# Patient Record
Sex: Male | Born: 1945 | Marital: Single | Smoking: Former smoker
Health system: Southern US, Community
[De-identification: ages and names within clinical notes are randomized; demographics above are authoritative.]

## PROBLEM LIST (undated history)

## (undated) DIAGNOSIS — I10 Essential (primary) hypertension: Secondary | ICD-10-CM

## (undated) DIAGNOSIS — E119 Type 2 diabetes mellitus without complications: Secondary | ICD-10-CM

## (undated) DIAGNOSIS — F329 Major depressive disorder, single episode, unspecified: Secondary | ICD-10-CM

## (undated) DIAGNOSIS — J189 Pneumonia, unspecified organism: Secondary | ICD-10-CM

## (undated) DIAGNOSIS — F32A Depression, unspecified: Secondary | ICD-10-CM

---

## 2016-06-07 ENCOUNTER — Other Ambulatory Visit (HOSPITAL_COMMUNITY): Payer: Self-pay | Admitting: Pulmonary Disease

## 2016-06-07 DIAGNOSIS — C799 Secondary malignant neoplasm of unspecified site: Secondary | ICD-10-CM

## 2016-06-10 ENCOUNTER — Other Ambulatory Visit (HOSPITAL_COMMUNITY): Payer: Self-pay

## 2016-06-10 ENCOUNTER — Ambulatory Visit (HOSPITAL_COMMUNITY): Payer: Self-pay

## 2016-06-14 ENCOUNTER — Inpatient Hospital Stay (HOSPITAL_COMMUNITY): Payer: Medicare Other

## 2016-06-14 ENCOUNTER — Inpatient Hospital Stay (HOSPITAL_COMMUNITY)
Admission: AD | Admit: 2016-06-14 | Discharge: 2016-06-21 | DRG: 208 | Disposition: A | Discharge status: Other Acute Inpatient Hospital | Payer: Medicare Other | Source: Other Acute Inpatient Hospital | Attending: Internal Medicine | Admitting: Internal Medicine

## 2016-06-14 DIAGNOSIS — K219 Gastro-esophageal reflux disease without esophagitis: Secondary | ICD-10-CM | POA: Diagnosis present

## 2016-06-14 DIAGNOSIS — D72829 Elevated white blood cell count, unspecified: Secondary | ICD-10-CM

## 2016-06-14 DIAGNOSIS — J181 Lobar pneumonia, unspecified organism: Secondary | ICD-10-CM | POA: Diagnosis not present

## 2016-06-14 DIAGNOSIS — I480 Paroxysmal atrial fibrillation: Secondary | ICD-10-CM | POA: Diagnosis present

## 2016-06-14 DIAGNOSIS — R4182 Altered mental status, unspecified: Secondary | ICD-10-CM

## 2016-06-14 DIAGNOSIS — R0602 Shortness of breath: Secondary | ICD-10-CM | POA: Diagnosis present

## 2016-06-14 DIAGNOSIS — J9811 Atelectasis: Secondary | ICD-10-CM | POA: Diagnosis present

## 2016-06-14 DIAGNOSIS — J189 Pneumonia, unspecified organism: Secondary | ICD-10-CM | POA: Diagnosis not present

## 2016-06-14 DIAGNOSIS — J90 Pleural effusion, not elsewhere classified: Secondary | ICD-10-CM | POA: Diagnosis present

## 2016-06-14 DIAGNOSIS — E43 Unspecified severe protein-calorie malnutrition: Secondary | ICD-10-CM | POA: Diagnosis present

## 2016-06-14 DIAGNOSIS — F329 Major depressive disorder, single episode, unspecified: Secondary | ICD-10-CM | POA: Diagnosis present

## 2016-06-14 DIAGNOSIS — Z9911 Dependence on respirator [ventilator] status: Secondary | ICD-10-CM | POA: Diagnosis not present

## 2016-06-14 DIAGNOSIS — E669 Obesity, unspecified: Secondary | ICD-10-CM | POA: Diagnosis present

## 2016-06-14 DIAGNOSIS — Z930 Tracheostomy status: Secondary | ICD-10-CM

## 2016-06-14 DIAGNOSIS — I132 Hypertensive heart and chronic kidney disease with heart failure and with stage 5 chronic kidney disease, or end stage renal disease: Secondary | ICD-10-CM | POA: Diagnosis present

## 2016-06-14 DIAGNOSIS — N186 End stage renal disease: Secondary | ICD-10-CM | POA: Diagnosis present

## 2016-06-14 DIAGNOSIS — Z87891 Personal history of nicotine dependence: Secondary | ICD-10-CM

## 2016-06-14 DIAGNOSIS — J9622 Acute and chronic respiratory failure with hypercapnia: Secondary | ICD-10-CM | POA: Diagnosis present

## 2016-06-14 DIAGNOSIS — Z789 Other specified health status: Secondary | ICD-10-CM

## 2016-06-14 DIAGNOSIS — Z430 Encounter for attention to tracheostomy: Secondary | ICD-10-CM

## 2016-06-14 DIAGNOSIS — E872 Acidosis: Secondary | ICD-10-CM | POA: Diagnosis present

## 2016-06-14 DIAGNOSIS — L89150 Pressure ulcer of sacral region, unstageable: Secondary | ICD-10-CM | POA: Diagnosis present

## 2016-06-14 DIAGNOSIS — I509 Heart failure, unspecified: Secondary | ICD-10-CM | POA: Diagnosis present

## 2016-06-14 DIAGNOSIS — G9340 Encephalopathy, unspecified: Secondary | ICD-10-CM | POA: Diagnosis present

## 2016-06-14 DIAGNOSIS — Z6823 Body mass index (BMI) 23.0-23.9, adult: Secondary | ICD-10-CM

## 2016-06-14 DIAGNOSIS — J440 Chronic obstructive pulmonary disease with acute lower respiratory infection: Secondary | ICD-10-CM | POA: Diagnosis present

## 2016-06-14 DIAGNOSIS — J9690 Respiratory failure, unspecified, unspecified whether with hypoxia or hypercapnia: Secondary | ICD-10-CM | POA: Diagnosis not present

## 2016-06-14 DIAGNOSIS — E870 Hyperosmolality and hypernatremia: Secondary | ICD-10-CM | POA: Diagnosis not present

## 2016-06-14 DIAGNOSIS — E875 Hyperkalemia: Secondary | ICD-10-CM | POA: Diagnosis not present

## 2016-06-14 DIAGNOSIS — R310 Gross hematuria: Secondary | ICD-10-CM | POA: Diagnosis not present

## 2016-06-14 DIAGNOSIS — E1122 Type 2 diabetes mellitus with diabetic chronic kidney disease: Secondary | ICD-10-CM | POA: Diagnosis present

## 2016-06-14 DIAGNOSIS — J151 Pneumonia due to Pseudomonas: Secondary | ICD-10-CM | POA: Diagnosis present

## 2016-06-14 DIAGNOSIS — E785 Hyperlipidemia, unspecified: Secondary | ICD-10-CM | POA: Diagnosis present

## 2016-06-14 DIAGNOSIS — N319 Neuromuscular dysfunction of bladder, unspecified: Secondary | ICD-10-CM | POA: Diagnosis present

## 2016-06-14 DIAGNOSIS — N170 Acute kidney failure with tubular necrosis: Secondary | ICD-10-CM | POA: Diagnosis present

## 2016-06-14 DIAGNOSIS — L8990 Pressure ulcer of unspecified site, unspecified stage: Secondary | ICD-10-CM | POA: Insufficient documentation

## 2016-06-14 DIAGNOSIS — D62 Acute posthemorrhagic anemia: Secondary | ICD-10-CM | POA: Diagnosis not present

## 2016-06-14 DIAGNOSIS — J9620 Acute and chronic respiratory failure, unspecified whether with hypoxia or hypercapnia: Secondary | ICD-10-CM | POA: Diagnosis not present

## 2016-06-14 DIAGNOSIS — I5032 Chronic diastolic (congestive) heart failure: Secondary | ICD-10-CM | POA: Diagnosis present

## 2016-06-14 DIAGNOSIS — E871 Hypo-osmolality and hyponatremia: Secondary | ICD-10-CM | POA: Diagnosis present

## 2016-06-14 DIAGNOSIS — J9621 Acute and chronic respiratory failure with hypoxia: Secondary | ICD-10-CM | POA: Diagnosis present

## 2016-06-14 DIAGNOSIS — J441 Chronic obstructive pulmonary disease with (acute) exacerbation: Secondary | ICD-10-CM | POA: Diagnosis present

## 2016-06-14 DIAGNOSIS — E869 Volume depletion, unspecified: Secondary | ICD-10-CM | POA: Diagnosis present

## 2016-06-14 DIAGNOSIS — T829XXA Unspecified complication of cardiac and vascular prosthetic device, implant and graft, initial encounter: Secondary | ICD-10-CM

## 2016-06-14 DIAGNOSIS — Z1624 Resistance to multiple antibiotics: Secondary | ICD-10-CM | POA: Diagnosis present

## 2016-06-14 HISTORY — DX: Major depressive disorder, single episode, unspecified: F32.9

## 2016-06-14 HISTORY — DX: Pneumonia, unspecified organism: J18.9

## 2016-06-14 HISTORY — DX: Essential (primary) hypertension: I10

## 2016-06-14 HISTORY — DX: Type 2 diabetes mellitus without complications: E11.9

## 2016-06-14 HISTORY — DX: Depression, unspecified: F32.A

## 2016-06-14 LAB — CBC WITH DIFFERENTIAL/PLATELET
BASOS PCT: 1 %
Basophils Absolute: 0.2 10*3/uL — ABNORMAL HIGH (ref 0.0–0.1)
EOS PCT: 0 %
Eosinophils Absolute: 0 10*3/uL (ref 0.0–0.7)
HEMATOCRIT: 21.4 % — AB (ref 39.0–52.0)
HEMOGLOBIN: 7.3 g/dL — AB (ref 13.0–17.0)
LYMPHS ABS: 1.2 10*3/uL (ref 0.7–4.0)
Lymphocytes Relative: 5 %
MCH: 31.5 pg (ref 26.0–34.0)
MCHC: 34.1 g/dL (ref 30.0–36.0)
MCV: 92.2 fL (ref 78.0–100.0)
Monocytes Absolute: 0.7 10*3/uL (ref 0.1–1.0)
Monocytes Relative: 3 %
Neutro Abs: 22.7 10*3/uL — ABNORMAL HIGH (ref 1.7–7.7)
Neutrophils Relative %: 91 %
Platelets: 274 10*3/uL (ref 150–400)
RBC: 2.32 MIL/uL — AB (ref 4.22–5.81)
RDW: 17.1 % — ABNORMAL HIGH (ref 11.5–15.5)
WBC Morphology: INCREASED
WBC: 24.8 10*3/uL — AB (ref 4.0–10.5)

## 2016-06-14 LAB — COMPREHENSIVE METABOLIC PANEL
ALBUMIN: 1.5 g/dL — AB (ref 3.5–5.0)
ALK PHOS: 302 U/L — AB (ref 38–126)
ALT: 88 U/L — AB (ref 17–63)
ANION GAP: 14 (ref 5–15)
AST: 101 U/L — AB (ref 15–41)
BILIRUBIN TOTAL: 0.9 mg/dL (ref 0.3–1.2)
BUN: 160 mg/dL — AB (ref 6–20)
CALCIUM: 7.4 mg/dL — AB (ref 8.9–10.3)
CO2: 25 mmol/L (ref 22–32)
Chloride: 94 mmol/L — ABNORMAL LOW (ref 101–111)
Creatinine, Ser: 2.48 mg/dL — ABNORMAL HIGH (ref 0.61–1.24)
GFR calc Af Amer: 29 mL/min — ABNORMAL LOW (ref >60)
GFR calc non Af Amer: 25 mL/min — ABNORMAL LOW (ref >60)
GLUCOSE: 229 mg/dL — AB (ref 65–99)
Potassium: 4.4 mmol/L (ref 3.5–5.1)
SODIUM: 133 mmol/L — AB (ref 135–145)
TOTAL PROTEIN: 6.2 g/dL — AB (ref 6.5–8.1)

## 2016-06-14 LAB — PROTIME-INR
INR: 1.22
PROTHROMBIN TIME: 15.5 s — AB (ref 11.4–15.2)

## 2016-06-14 LAB — DIGOXIN LEVEL: Digoxin Level: 0.6 ng/mL — ABNORMAL LOW (ref 0.8–2.0)

## 2016-06-14 LAB — POCT I-STAT 3, ART BLOOD GAS (G3+)
Acid-Base Excess: 2 mmol/L (ref 0.0–2.0)
BICARBONATE: 26.3 mmol/L (ref 20.0–28.0)
O2 Saturation: 95 %
PO2 ART: 74 mmHg — AB (ref 83.0–108.0)
Patient temperature: 98.6
TCO2: 28 mmol/L (ref 0–100)
pCO2 arterial: 40.2 mmHg (ref 32.0–48.0)
pH, Arterial: 7.424 (ref 7.350–7.450)

## 2016-06-14 LAB — GLUCOSE, CAPILLARY: Glucose-Capillary: 201 mg/dL — ABNORMAL HIGH (ref 65–99)

## 2016-06-14 LAB — URINALYSIS, ROUTINE W REFLEX MICROSCOPIC
BILIRUBIN URINE: NEGATIVE
Glucose, UA: NEGATIVE mg/dL
Ketones, ur: NEGATIVE mg/dL
NITRITE: NEGATIVE
PROTEIN: 100 mg/dL — AB
Specific Gravity, Urine: 1.011 (ref 1.005–1.030)
Squamous Epithelial / LPF: NONE SEEN
pH: 8 (ref 5.0–8.0)

## 2016-06-14 LAB — PHOSPHORUS: Phosphorus: 5.6 mg/dL — ABNORMAL HIGH (ref 2.5–4.6)

## 2016-06-14 LAB — LACTIC ACID, PLASMA: Lactic Acid, Venous: 0.8 mmol/L (ref 0.5–1.9)

## 2016-06-14 LAB — PROCALCITONIN: Procalcitonin: 1.59 ng/mL

## 2016-06-14 LAB — MAGNESIUM: Magnesium: 2 mg/dL (ref 1.7–2.4)

## 2016-06-14 MED ORDER — PRISMASOL BGK 4/2.5 32-4-2.5 MEQ/L IV SOLN
INTRAVENOUS | Status: DC
  Administered 2016-06-14 – 2016-06-15 (×2): via INTRAVENOUS_CENTRAL
  Filled 2016-06-14 (×2): qty 5000

## 2016-06-14 MED ORDER — SODIUM CHLORIDE 0.9 % IV SOLN
250.0000 mL | INTRAVENOUS | Status: DC | PRN
  Administered 2016-06-15: 250 mL via INTRAVENOUS

## 2016-06-14 MED ORDER — PRISMASOL BGK 4/2.5 32-4-2.5 MEQ/L IV SOLN
INTRAVENOUS | Status: DC
  Administered 2016-06-14 – 2016-06-15 (×2): via INTRAVENOUS_CENTRAL
  Filled 2016-06-14 (×3): qty 5000

## 2016-06-14 MED ORDER — SODIUM CHLORIDE 0.9 % FOR CRRT
INTRAVENOUS_CENTRAL | Status: DC | PRN
  Filled 2016-06-14: qty 1000

## 2016-06-14 MED ORDER — HEPARIN SODIUM (PORCINE) 1000 UNIT/ML DIALYSIS
1000.0000 [IU] | INTRAMUSCULAR | Status: DC | PRN
Start: 2016-06-14 — End: 2016-06-16
  Administered 2016-06-16: 5000 [IU] via INTRAVENOUS_CENTRAL
  Filled 2016-06-14: qty 6
  Filled 2016-06-14: qty 5

## 2016-06-14 MED ORDER — HEPARIN SODIUM (PORCINE) 5000 UNIT/ML IJ SOLN
5000.0000 [IU] | Freq: Three times a day (TID) | INTRAMUSCULAR | Status: DC
  Administered 2016-06-14: 5000 [IU] via SUBCUTANEOUS
  Filled 2016-06-14: qty 1

## 2016-06-14 MED ORDER — CHLORHEXIDINE GLUCONATE 0.12% ORAL RINSE (MEDLINE KIT)
15.0000 mL | Freq: Two times a day (BID) | OROMUCOSAL | Status: DC
  Administered 2016-06-15 – 2016-06-21 (×14): 15 mL via OROMUCOSAL

## 2016-06-14 MED ORDER — LEVOFLOXACIN IN D5W 250 MG/50ML IV SOLN
250.0000 mg | INTRAVENOUS | Status: DC
  Administered 2016-06-15: 250 mg via INTRAVENOUS
  Filled 2016-06-14: qty 50

## 2016-06-14 MED ORDER — SODIUM CHLORIDE 0.9 % IV SOLN
1250.0000 mg | Freq: Once | INTRAVENOUS | Status: AC
  Administered 2016-06-14: 1250 mg via INTRAVENOUS
  Filled 2016-06-14: qty 1250

## 2016-06-14 MED ORDER — SODIUM CHLORIDE 0.9% FLUSH: 3.0000 mL | INTRAVENOUS | Status: DC | PRN

## 2016-06-14 MED ORDER — SODIUM CHLORIDE 0.9% FLUSH
3.0000 mL | Freq: Two times a day (BID) | INTRAVENOUS | Status: DC
  Administered 2016-06-14: 3 mL via INTRAVENOUS

## 2016-06-14 MED ORDER — MIDAZOLAM HCL 2 MG/2ML IJ SOLN
INTRAMUSCULAR | Status: AC
  Filled 2016-06-14: qty 2

## 2016-06-14 MED ORDER — ORAL CARE MOUTH RINSE
15.0000 mL | Freq: Four times a day (QID) | OROMUCOSAL | Status: DC
  Administered 2016-06-15 – 2016-06-21 (×26): 15 mL via OROMUCOSAL

## 2016-06-14 MED ORDER — PANTOPRAZOLE SODIUM 40 MG IV SOLR
40.0000 mg | Freq: Every day | INTRAVENOUS | Status: DC
  Administered 2016-06-14 – 2016-06-20 (×7): 40 mg via INTRAVENOUS
  Filled 2016-06-14 (×7): qty 40

## 2016-06-14 MED ORDER — IPRATROPIUM-ALBUTEROL 0.5-2.5 (3) MG/3ML IN SOLN
3.0000 mL | Freq: Four times a day (QID) | RESPIRATORY_TRACT | Status: DC
  Administered 2016-06-14 – 2016-06-19 (×21): 3 mL via RESPIRATORY_TRACT
  Filled 2016-06-14 (×22): qty 3

## 2016-06-14 MED ORDER — LEVOFLOXACIN IN D5W 500 MG/100ML IV SOLN
500.0000 mg | Freq: Once | INTRAVENOUS | Status: AC
  Administered 2016-06-14: 500 mg via INTRAVENOUS
  Filled 2016-06-14: qty 100

## 2016-06-14 MED ORDER — VANCOMYCIN HCL IN DEXTROSE 750-5 MG/150ML-% IV SOLN
750.0000 mg | INTRAVENOUS | Status: DC
  Administered 2016-06-14 – 2016-06-15 (×2): 750 mg via INTRAVENOUS
  Filled 2016-06-14 (×2): qty 150

## 2016-06-14 MED ORDER — BUDESONIDE 0.5 MG/2ML IN SUSP
0.5000 mg | Freq: Two times a day (BID) | RESPIRATORY_TRACT | Status: DC
  Administered 2016-06-14 – 2016-06-21 (×14): 0.5 mg via RESPIRATORY_TRACT
  Filled 2016-06-14 (×14): qty 2

## 2016-06-14 MED ORDER — INSULIN ASPART 100 UNIT/ML ~~LOC~~ SOLN
2.0000 [IU] | SUBCUTANEOUS | Status: DC
  Administered 2016-06-14 – 2016-06-15 (×3): 6 [IU] via SUBCUTANEOUS

## 2016-06-14 MED ORDER — PRISMASOL BGK 4/2.5 32-4-2.5 MEQ/L IV SOLN
INTRAVENOUS | Status: DC
  Administered 2016-06-14 – 2016-06-16 (×13): via INTRAVENOUS_CENTRAL
  Filled 2016-06-14 (×19): qty 5000

## 2016-06-14 MED ORDER — MIDAZOLAM HCL 2 MG/2ML IJ SOLN: 2.0000 mg | Freq: Once | INTRAMUSCULAR | Status: DC

## 2016-06-14 MED ORDER — SODIUM CHLORIDE 0.9 % IV SOLN
250.0000 mL | INTRAVENOUS | Status: DC | PRN
  Administered 2016-06-14: 250 mL via INTRAVENOUS

## 2016-06-14 MED ORDER — DEXTROSE 5 % IV SOLN
2.0000 g | Freq: Two times a day (BID) | INTRAVENOUS | Status: DC
  Administered 2016-06-14 – 2016-06-15 (×3): 2 g via INTRAVENOUS
  Filled 2016-06-14 (×4): qty 2

## 2016-06-14 NOTE — H&P (Signed)
PULMONARY / CRITICAL CARE MEDICINE   Name: Bryan Frank MRN: [id number] DOB: 27-Mar-1945    ADMISSION DATE:  06/14/2016 CONSULTATION DATE:  6/5  REFERRING MD:  [facility]  CHIEF COMPLAINT:  Renal failure  HISTORY OF PRESENT ILLNESS:   71 year old male with PMH as below, which is significant for DM2, HLD, GERD, HTN, and tobacco abuse. It appears as though he was admitted to [facility] [place] [facility] 04/2016 for severe COPD exacerbation and dense pneumonia. Also concern for UTI and treated with broad spectrum antibiotics. He improved and was transferred to [facility] in [place] 4/19. At that time he was on supplemental O2 during the day and BiPAP at night. On 4/21 his condition worsened requiring intubation. Likely due to severe COPD and possible PNA. He was unable to wean and had tracheostomy placed 4/27. He was found to have bilateral pleural effusions and is s/p pleurex catheter and thoracentesis. Cytology so far negative. He also required HD for worsening renal failure. This continued to worsen. Course also complicated by copious respiratory secretions. 6/5 he was transferred to [facility] at family request for further care and continued hemodialysis.   PAST MEDICAL HISTORY :  He  has no past medical history on file.  PAST SURGICAL HISTORY: He  has no past surgical history on file.  Allergies not on file  No current facility-administered medications on file prior to encounter.    No current outpatient prescriptions on file prior to encounter.    FAMILY HISTORY:  His has no family status information on file.    SOCIAL HISTORY: He    REVIEW OF SYSTEMS:   unable  SUBJECTIVE:  unable  VITAL SIGNS: BP 132/71   Pulse (!) 102   Resp (!) 23   Wt 80.2 kg (176 lb 12.9 oz)   SpO2 100%   HEMODYNAMICS:    VENTILATOR SETTINGS: Vent Mode: PRVC FiO2 (%):  [40 %] 40 % Set Rate:  [16 bmp] 16 bmp Vt Set:  [500 mL] 500 mL PEEP:  [5 cmH20] 5 cmH20  INTAKE / OUTPUT: No  intake/output data recorded.  PHYSICAL EXAMINATION: General:  Elderly male in NAD on vent Neuro:  unresponsive HEENT:  [place]/AT, PERRL, NO JVD. Trach in place Cardiovascular:  RRR, no MRG Lungs:  Minimal wheeze, diminished bases Abdomen:  Soft, non-distended. Erythema to PEG site Musculoskeletal:  No acute deformity Skin:  Large sacral decub to bone.   LABS:  BMET No results for input(s): NA, K, CL, CO2, BUN, CREATININE, GLUCOSE in the last 168 hours.  Electrolytes No results for input(s): CALCIUM, MG, PHOS in the last 168 hours.  CBC No results for input(s): WBC, HGB, HCT, PLT in the last 168 hours.  Coag's No results for input(s): APTT, INR in the last 168 hours.  Sepsis Markers No results for input(s): LATICACIDVEN, PROCALCITON, O2SATVEN in the last 168 hours.  ABG No results for input(s): PHART, PCO2ART, PO2ART in the last 168 hours.  Liver Enzymes No results for input(s): AST, ALT, ALKPHOS, BILITOT, ALBUMIN in the last 168 hours.  Cardiac Enzymes No results for input(s): TROPONINI, PROBNP in the last 168 hours.  Glucose No results for input(s): GLUCAP in the last 168 hours.  Imaging No results found.   STUDIES:  Ct head 6/5 >>>  CULTURES: Tracheal asp 6/3 > pseudomonas, corynebacterium, klebsiella.   ANTIBIOTICS: Cefepime 5/29 >6/5 Ceftaz 6/5 > Levaquin 6/5 > Vancomycin 6/5 >  SIGNIFICANT EVENTS: [facility] to [facility]19 [facility]  LINES/TUBES: Trace 4/27 > PEG 4/27 > Tunneled RIJ HD cath 6/5 >  DISCUSSION:   ASSESSMENT / PLAN:  PULMONARY A: Acute on chronic hypoxemic and hypercarbic respiratory failure. Severe COPD with +/- exacerbation Pulmonary edema  HCAP - cultures pseudomonas and corynebacterium (no sens done) Bilateral pleural effusions  P:   Full vent support Weaning ability unclear ABG CXR VAP bundle Scheduled bronchodilators, nebulized steroids.  CARDIOVASCULAR A:  CHF unspecified  P:  Telemetry monitoring in  ICU setting Monitor hemodynamics Continue digoxin, isosorbide dinitrate, asa,    RENAL A:   AKI on CKD stage IV Neurogenic bladder Hyponatremia  P:   Nephrology following Planning CRRT Follow chemistries  GASTROINTESTINAL A:   GERD  P:   Protonix Continue TF  HEMATOLOGIC A:   ? Malignant lesion of sphenoid skull  P:  Follow CBC, Coags CT head pending  INFECTIOUS A:   HCAP - cultures pseudomonas, klebsiella, and corynebacterium (no sens done). Has been worsening despite treatment with cefepime Likely UTI Sacral decub PEG site erythema  P:   Ceftaz, Levaquin, vanco Pan cultured PCT UA pending Wound care consult  ENDOCRINE A:   DM  P:   SSI  NEUROLOGIC A:  Acute encephalopathy, etiology unclear  P:   RASS goal: 0 CT head pending   FAMILY  - Updates: No family present  - Inter-disciplinary family meet or Palliative Care meeting due by:  6/12   Georgann Housekeeper, AGACNP-BC [place] [facility]/Critical Care Pager [phone] or [phone]  06/14/2016 8:15 PM     ATTENDING NOTE / ATTESTATION NOTE :   I have discussed the case with the resident/APP  Georgann Housekeeper NP  I agree with the resident/APP's  history, physical examination, assessment, and plans.    I have edited the above note and modified it according to our agreed history, physical examination, assessment and plan.   Briefly, 71 year old male with PMH as below, which is significant for DM2, HLD, GERD, HTN, and tobacco abuse. No information available in EPIC. Chart review was done by the team.  It appears as though he was admitted to [place], [place] hospital 04/2016 for severe COPD exacerbation and dense pneumonia. Also concern for UTI and treated with broad spectrum antibiotics. He improved and was transferred to [facility] in [place] 4/19. At that time he was on supplemental O2 during the day and BiPAP at night. On 4/21 his condition worsened requiring intubation. Likely  due to severe COPD and possible PNA. He was unable to wean and had tracheostomy placed 4/27. He was found to have bilateral pleural effusions and is s/p pleurex catheter and thoracentesis. Cytology so far negative. He also required HD for worsening renal failure. This continued to worsen. Course also complicated by copious respiratory secretions. 6/5 he was transferred to [facility] at family request for further care and continued hemodialysis. Renal service was following pt at [place]. He had his HD catheter placed today.  Respiratory secretions with PSA and Corynebacterium.   Vitals:  Vitals:   06/14/16 1904 06/14/16 1935 06/14/16 2103  BP: 132/71    Pulse: (!) 102    Resp: (!) 23    Temp:  98.6 F (37 C)   TempSrc:  Oral   SpO2: 100%  100%  Weight: 80.2 kg (176 lb 12.9 oz)      Constitutional/General: well-nourished, well-developed, intubated, not in any distress, comfortable  There is no height or weight on file to calculate BMI. Wt Readings from Last 3 Encounters:  06/14/16 80.2 kg (176 lb 12.9 oz)    HEENT: PERLA, anicteric sclerae. (-) Oral thrush. Trache in place with thick nasty looking secretions per trache.   Neck: No masses. Midline trachea. No JVD, (-) LAD. (-) bruits appreciated.  Respiratory/Chest: Grossly normal chest. (-) deformity. (-) Accessory muscle use.  Symmetric expansion. Diminished BS on both lower lung zones. (-) wheezing, crackles, (+) rhonchi at bases.  (-) egophony  Cardiovascular: Regular rate and  rhythm, heart sounds normal, no murmur or gallops,  Trace peripheral edema  Gastrointestinal:  Normal bowel sounds. Soft, non-tender. No hepatosplenomegaly.  (-) masses.   Musculoskeletal:  Unable to assess.  Extremities: Grossly normal. (-) clubbing, cyanosis.  (-) edema  Skin: (-) rash,lesions seen.   Neurological/Psychiatric : . Pupils were sluggish. Corneals were reactive. No facial symmetry. Does not follow commands. May be some grimace on  deep sternal rub. No purposeful movement. Supple neck. (-) lateralizing signs    CBC Recent Labs     06/14/16  1956  WBC  24.8*  HGB  7.3*  HCT  21.4*  PLT  274    Coag's Recent Labs     06/14/16  1956  INR  1.22    BMET Recent Labs     06/14/16  1956  NA  133*  K  4.4  CL  94*  CO2  25  BUN  160*  CREATININE  2.48*  GLUCOSE  229*    Electrolytes Recent Labs     06/14/16  1956  CALCIUM  7.4*  MG  2.0  PHOS  5.6*    Sepsis Markers No results for input(s): PROCALCITON, O2SATVEN in the last 72 hours.  Invalid input(s): LACTICACIDVEN  ABG Recent Labs     06/14/16  2006  PHART  7.424  PCO2ART  40.2  PO2ART  74.0*    Liver Enzymes Recent Labs     06/14/16  1956  AST  101*  ALT  88*  ALKPHOS  302*  BILITOT  0.9  ALBUMIN  1.5*    Cardiac Enzymes No results for input(s): TROPONINI, PROBNP in the last 72 hours.  Glucose Recent Labs     06/14/16  1931  GLUCAP  201*    Imaging Ct Head Wo Contrast  Result Date: 06/14/2016 CLINICAL DATA:  Altered mental status EXAM: CT HEAD WITHOUT CONTRAST TECHNIQUE: Contiguous axial images were obtained from the base of the skull through the vertex without intravenous contrast. COMPARISON:  None. FINDINGS: Brain: No mass lesion, intraparenchymal hemorrhage or extra-axial collection. No evidence of acute cortical infarct. There is periventricular hypoattenuation compatible with chronic microvascular disease. There is diffuse atrophy without lobar predominance. Vascular: Atherosclerotic calcification of the vertebral and internal carotid arteries at the skull base. Skull: Normal visualized skull base, calvarium and extracranial soft tissues. Sinuses/Orbits: No sinus fluid levels or advanced mucosal thickening. No mastoid effusion. Normal orbits. IMPRESSION: Chronic microvascular ischemia without acute intracranial abnormality. Electronically Signed   By: Ulyses Jarred M.D.   On: 06/14/2016 21:20   Dg Chest Port 1  View  Result Date: 06/14/2016 CLINICAL DATA:  Pneumonia. EXAM: PORTABLE CHEST 1 VIEW COMPARISON:  None. FINDINGS: Patient is rotated to the left. Tracheostomy tube appears in adequate position. There is a right IJ central venous catheter with tip just below the cavoatrial junction. Lungs are adequately inflated with opacification over the left base which may be due to effusion and atelectasis although cannot exclude infection. Mild cardiomegaly. Calcified plaque is present over the aortic arch.  There is minimal degenerate change of the spine. IMPRESSION: Left base opacification which may be due to atelectasis and effusion versus infection. Follow-up PA and lateral chest x-ray would be helpful for further evaluation. Cardiomegaly. Aortic atherosclerosis. Tubes and lines as described. Electronically Signed   By: Marin Olp M.D.   On: 06/14/2016 20:08    Assessment/Plan : Encephalopathy, multifactorial. Likely related to medicines/sedatives in a background of chronic kidney disease. Could be related to sepsis. Could be related to acute on chronic kidney disease. - Cranial CT scan was unremarkable. If he does not wake up in the morning, he will most likely need a brain MRI +/- EEG +/- LP.   - Avoid sedatives. We want to let patient wake up and see how he does neurologically. - Panculture. Continue broad-spectrum antibiotics pending culture. - Renal service has already seen the patient. They're familiar with the patient. They have ordered CRRT.   Concern for sepsis likely related to tracheitis vs VAP. Patient with history of pseudomonas aeruginosa and Corynebacterium. Urine also looks cloudy.  - Panculture. Continue ceftazidime and levofloxacin and vancomycin. De-escalate once with cultures. - check PCT. - May need a pan scan of the chest and abdomen if he's not significantly improved in the morning. Chest x-ray however is only remarkable for a possible left lower lobe atelectasis vs infiltrate.     Acute hypoxemic respiratory failure secondary to unable to protect airway +/- Tracheitis vs VAP.S/P tracheostomy - Weaning once he is awake.Mental status is barrier.  - Panculture. Cont antibiotics. - Pulmicort neb BID + Duoneb QID - check echo ( R/O CHF/CAD)   AKI/CKD - renal service following - plan for CRRT   Severe  Malnutrition. - Start tube  Feeds.   DVT prophylaxis : heparin SQ PPI for SUP   I spent  30  minutes of Critical Care time with this patient today. This is my time spent independent of the APP or resident.   Family :   No family at bedside.    Monica Becton, MD 06/14/2016, 9:44 PM [facility] Pager [phone] After 3 pm or if no answer, call [phone]

## 2016-06-14 NOTE — Progress Notes (Signed)
Pharmacy Antibiotic Note  Bryan Frank is a 71 y.o. male transferred to [facility] on 06/14/2016 due to acute renal failure.  Pharmacy has been consulted for vancomycin, fortaz, and levaquin dosing.  71 year old male with PMH as below, which is significant for DM2, HLD, GERD, HTN, and tobacco abuse.  Patient transferred from [facility] this evening. He was being treated with cefepime for his HCAP. Will transition to new broad antibiotics. Patient also starting CRRT for his acute renal failure so will need to adjust dosing. See section below and paper chart for organisms and sensitivities from [facility].  Plan: Vancomycin 1250mg  x 1 then 750mg  IV every 24 hours hours.  Goal trough 15-20 mcg/mL.  Ceftazidime 2g q12 hours Levaquin 500mg  x1 tonight then 250mg  q24 hours  Weight: 176 lb 12.9 oz (80.2 kg)  Temp (24hrs), Avg:98.6 F (37 C), Min:98.6 F (37 C), Max:98.6 F (37 C)   Recent Labs Lab 06/14/16 1956  WBC 24.8*  CREATININE 2.48*  LATICACIDVEN 0.8    CrCl cannot be calculated (Unknown ideal weight.).    No Known Allergies  Antimicrobials this admission: Vanc 6/5 >> Fortaz 6/5>> Levaquin 6/5>>  Microbiology results: 5/31 Respiratory cx: carbapenemase resistant pseudomonas - I to fortaz, S to amikacin Corynebacterium: no sensitivities Kleb peumo: Pan sensitive with I sensitivities to ampicillin  Thank you for allowing pharmacy to be a part of this patient's care.  Erin Hearing PharmD., BCPS Clinical Pharmacist Pager [phone] 06/14/2016 9:06 PM

## 2016-06-14 NOTE — Progress Notes (Signed)
[facility] Progress Note Patient Name: Bryan Frank DOB: 12-08-1945 MRN: [id number]   Date of Service  06/14/2016  HPI/Events of Note  Hematuria - s/p foley change. Last Hgb = 7.3, INR = 1.22 and Platelets = 274K.  eICU Interventions  Will order: 1. H/H Q 6 hours. 2. PTT now.  3. D/C Heparin [place].     Intervention Category Major Interventions: Other:  Lysle Dingwall 06/14/2016, 10:57 PM

## 2016-06-14 NOTE — Consult Note (Signed)
Referring Provider: No ref. provider found Primary Care Physician:  No primary care provider on file. Primary Nephrologist:  none   Reason for Consultation:   Azotemia acute non oliguric renal failure and hyperkalemia  HPI:  [facility] well known to me. Patient debilitated transferred from [place] and has been hospitalized in [facility] with a prolonged ventilator wean. He was initially stable and had developed acute renal failure requiring intermittent HD. He had a tracheostomy and was weaned to a T bar. Over the course of the last 2 weeks he had recovered renal function and was clinically looking better however over the weekend he became more confused and was found to have a climbing wbc with worsening respiratory status. He was place back on the ventilator. Although non oliguric, his BUN has been disproportionately elevated. It was considered that sepsis from worsening pneumonia was playing a considerable role. He has a pleural effusion drained to help improve his lung mechanics last week and cytology was pending. He was placed on cefipime. He has become increasingly hyperkalemic  No past medical history on file.  No past surgical history on file.  Prior to Admission medications   Not on File    No current facility-administered medications for this encounter.     Allergies as of 06/14/2016  . (Not on File)    No family history on file.  Social History   Social History  . Marital status: N/A    Spouse name: N/A  . Number of children: N/A  . Years of education: N/A   Occupational History  . Not on file.   Social History Main Topics  . Smoking status: Not on file  . Smokeless tobacco: Not on file  . Alcohol use Not on file  . Drug use: Unknown  . Sexual activity: Not on file   Other Topics Concern  . Not on file   Social History Narrative  . No narrative on file    Review of Systems:  Unable to provide   Physical Exam: Vital signs in  last 24 hours: Temp:  [98.6 F (37 C)] 98.6 F (37 C) (06/05 1935) Pulse Rate:  [102] 102 (06/05 1904) Resp:  [23] 23 (06/05 1904) BP: (132)/(71) 132/71 (06/05 1904) SpO2:  [100 %] 100 % (06/05 1904) FiO2 (%):  [40 %] 40 % (06/05 1904) Weight:  [176 lb 12.9 oz (80.2 kg)] 176 lb 12.9 oz (80.2 kg) (06/05 1904)   General:   Ill appearing confused Head:  Normocephalic and atraumatic. Eyes:  Sclera clear, no icterus.   Conjunctiva pink. Ears:  Normal auditory acuity. Nose:  No deformity, discharge,  or lesions. Mouth:  No deformity or lesions, dentition normal. Neck:  Supple; no masses or thyromegaly. JVP not elevated Lungs:   Trach and ventilator  Permcath Heart:  Regular rate with systolic faint murmur  Abdomen:  Soft, nontender  Hypoactive    Msk:  Symmetrical without gross deformities. Normal posture. Pulses:  No carotid, renal, femoral bruits. DP and PT symmetrical and equal Extremities:  Without clubbing or edema. Neurologic:   Confused and disorientated  Skin:  Sacral wound stage 4  Cervical Nodes:  No significant cervical adenopathy.    Intake/Output from previous day: No intake/output data recorded. Intake/Output this shift: No intake/output data recorded.  Lab Results: No results for input(s): WBC, HGB, HCT, PLT in the last 72 hours. BMET No results for input(s): NA, K, CL, CO2, GLUCOSE, BUN, CREATININE, CALCIUM, PHOS in the last 72  hours.  Invalid input(s): MAG LFT No results for input(s): PROT, ALBUMIN, AST, ALT, ALKPHOS, BILITOT, BILIDIR, IBILI in the last 72 hours. PT/INR No results for input(s): LABPROT, INR in the last 72 hours. Hepatitis Panel No results for input(s): HEPBSAG, HCVAB, HEPAIGM, HEPBIGM in the last 72 hours.  Studies/Results: No results found.  Assessment/Plan:  Acute Renal insufficiency in setting of worsening sepsis . Will proceed with CRRT    Azotemia - catabolic, septic and dry with increased protein Tube feeds. Not sure that this is  going to improve without dialysis and certainly expectations of the family are to proceed with dialysis despite very poor condition.  Hyperkalemia  Should resolve with dialysis  Volume appears fairly controlled mild volume depletion  Metabolic acidosis should improve with dialysis  Sepsis  Antibiotics and cultures recommended  Wound care  Feeding TF   LOS: 0 Sanaai Doane W @TODAY @7 :54 PM

## 2016-06-15 ENCOUNTER — Inpatient Hospital Stay (HOSPITAL_COMMUNITY): Payer: Medicare Other

## 2016-06-15 ENCOUNTER — Encounter (HOSPITAL_COMMUNITY): Payer: Self-pay | Admitting: *Deleted

## 2016-06-15 DIAGNOSIS — J9621 Acute and chronic respiratory failure with hypoxia: Principal | ICD-10-CM

## 2016-06-15 DIAGNOSIS — J9622 Acute and chronic respiratory failure with hypercapnia: Secondary | ICD-10-CM

## 2016-06-15 LAB — CBC
HEMATOCRIT: 23.6 % — AB (ref 39.0–52.0)
HEMOGLOBIN: 7.5 g/dL — AB (ref 13.0–17.0)
MCH: 29.1 pg (ref 26.0–34.0)
MCHC: 31.8 g/dL (ref 30.0–36.0)
MCV: 91.5 fL (ref 78.0–100.0)
Platelets: 275 10*3/uL (ref 150–400)
RBC: 2.58 MIL/uL — AB (ref 4.22–5.81)
RDW: 17.2 % — ABNORMAL HIGH (ref 11.5–15.5)
WBC: 26.7 10*3/uL — AB (ref 4.0–10.5)

## 2016-06-15 LAB — GLUCOSE, CAPILLARY
GLUCOSE-CAPILLARY: 104 mg/dL — AB (ref 65–99)
GLUCOSE-CAPILLARY: 135 mg/dL — AB (ref 65–99)
GLUCOSE-CAPILLARY: 175 mg/dL — AB (ref 65–99)
GLUCOSE-CAPILLARY: 249 mg/dL — AB (ref 65–99)
Glucose-Capillary: 212 mg/dL — ABNORMAL HIGH (ref 65–99)
Glucose-Capillary: 112 mg/dL — ABNORMAL HIGH (ref 65–99)
Glucose-Capillary: 138 mg/dL — ABNORMAL HIGH (ref 65–99)

## 2016-06-15 LAB — BLOOD CULTURE ID PANEL (REFLEXED)
Acinetobacter baumannii: NOT DETECTED
CANDIDA PARAPSILOSIS: NOT DETECTED
Candida albicans: NOT DETECTED
Candida glabrata: NOT DETECTED
Candida krusei: NOT DETECTED
Candida tropicalis: NOT DETECTED
ENTEROCOCCUS SPECIES: NOT DETECTED
Enterobacter cloacae complex: NOT DETECTED
Enterobacteriaceae species: NOT DETECTED
Escherichia coli: NOT DETECTED
HAEMOPHILUS INFLUENZAE: NOT DETECTED
KLEBSIELLA OXYTOCA: NOT DETECTED
Klebsiella pneumoniae: NOT DETECTED
LISTERIA MONOCYTOGENES: NOT DETECTED
METHICILLIN RESISTANCE: DETECTED — AB
Neisseria meningitidis: NOT DETECTED
Proteus species: NOT DETECTED
Pseudomonas aeruginosa: NOT DETECTED
SERRATIA MARCESCENS: NOT DETECTED
STAPHYLOCOCCUS AUREUS BCID: NOT DETECTED
STREPTOCOCCUS PNEUMONIAE: NOT DETECTED
Staphylococcus species: DETECTED — AB
Streptococcus agalactiae: NOT DETECTED
Streptococcus pyogenes: NOT DETECTED
Streptococcus species: NOT DETECTED

## 2016-06-15 LAB — BASIC METABOLIC PANEL
ANION GAP: 12 (ref 5–15)
BUN: 123 mg/dL — ABNORMAL HIGH (ref 6–20)
CALCIUM: 7.6 mg/dL — AB (ref 8.9–10.3)
CHLORIDE: 95 mmol/L — AB (ref 101–111)
CO2: 26 mmol/L (ref 22–32)
Creatinine, Ser: 1.93 mg/dL — ABNORMAL HIGH (ref 0.61–1.24)
GFR calc Af Amer: 39 mL/min — ABNORMAL LOW (ref >60)
GFR calc non Af Amer: 34 mL/min — ABNORMAL LOW (ref >60)
GLUCOSE: 247 mg/dL — AB (ref 65–99)
POTASSIUM: 4 mmol/L (ref 3.5–5.1)
Sodium: 133 mmol/L — ABNORMAL LOW (ref 135–145)

## 2016-06-15 LAB — ABO/RH: ABO/RH(D): O POS

## 2016-06-15 LAB — RENAL FUNCTION PANEL
ALBUMIN: 1.6 g/dL — AB (ref 3.5–5.0)
ANION GAP: 10 (ref 5–15)
BUN: 53 mg/dL — AB (ref 6–20)
CO2: 26 mmol/L (ref 22–32)
Calcium: 7.6 mg/dL — ABNORMAL LOW (ref 8.9–10.3)
Chloride: 101 mmol/L (ref 101–111)
Creatinine, Ser: 0.93 mg/dL (ref 0.61–1.24)
GFR calc Af Amer: >60 mL/min (ref >60)
GFR calc non Af Amer: >60 mL/min (ref >60)
GLUCOSE: 119 mg/dL — AB (ref 65–99)
PHOSPHORUS: 2.4 mg/dL — AB (ref 2.5–4.6)
POTASSIUM: 3.3 mmol/L — AB (ref 3.5–5.1)
SODIUM: 137 mmol/L (ref 135–145)

## 2016-06-15 LAB — BLOOD GAS, ARTERIAL
Acid-Base Excess: 3 mmol/L — ABNORMAL HIGH (ref 0.0–2.0)
BICARBONATE: 27.5 mmol/L (ref 20.0–28.0)
DRAWN BY: 441371
FIO2: 35
O2 Content: 10 L/min
O2 Saturation: 95 %
PCO2 ART: 41.7 mmHg (ref 32.0–48.0)
PH ART: 7.425 (ref 7.350–7.450)
PO2 ART: 66.6 mmHg — AB (ref 83.0–108.0)
Patient temperature: 95.4

## 2016-06-15 LAB — ECHOCARDIOGRAM COMPLETE
HEIGHTINCHES: 69 in
Weight: 2804.25 oz

## 2016-06-15 LAB — ALBUMIN: Albumin: 1.5 g/dL — ABNORMAL LOW (ref 3.5–5.0)

## 2016-06-15 LAB — MAGNESIUM: Magnesium: 2 mg/dL (ref 1.7–2.4)

## 2016-06-15 LAB — HEMOGLOBIN AND HEMATOCRIT, BLOOD
HCT: 18.5 % — ABNORMAL LOW (ref 39.0–52.0)
Hemoglobin: 6.4 g/dL — CL (ref 13.0–17.0)

## 2016-06-15 LAB — PHOSPHORUS: PHOSPHORUS: 4.7 mg/dL — AB (ref 2.5–4.6)

## 2016-06-15 LAB — PREPARE RBC (CROSSMATCH)

## 2016-06-15 LAB — PROCALCITONIN: PROCALCITONIN: 0.93 ng/mL

## 2016-06-15 LAB — MRSA PCR SCREENING: MRSA BY PCR: NEGATIVE

## 2016-06-15 LAB — APTT: APTT: 39 s — AB (ref 24–36)

## 2016-06-15 MED ORDER — INSULIN ASPART 100 UNIT/ML ~~LOC~~ SOLN
0.0000 [IU] | SUBCUTANEOUS | Status: DC
  Administered 2016-06-15: 2 [IU] via SUBCUTANEOUS
  Administered 2016-06-15: 3 [IU] via SUBCUTANEOUS
  Administered 2016-06-15: 2 [IU] via SUBCUTANEOUS
  Administered 2016-06-16: 5 [IU] via SUBCUTANEOUS
  Administered 2016-06-16 (×2): 3 [IU] via SUBCUTANEOUS
  Administered 2016-06-16 – 2016-06-18 (×10): 5 [IU] via SUBCUTANEOUS
  Administered 2016-06-18: 3 [IU] via SUBCUTANEOUS
  Administered 2016-06-18: 5 [IU] via SUBCUTANEOUS
  Administered 2016-06-18 – 2016-06-19 (×4): 3 [IU] via SUBCUTANEOUS
  Administered 2016-06-19 (×2): 5 [IU] via SUBCUTANEOUS
  Administered 2016-06-19: 3 [IU] via SUBCUTANEOUS
  Administered 2016-06-20 (×2): 5 [IU] via SUBCUTANEOUS
  Administered 2016-06-20 (×2): 3 [IU] via SUBCUTANEOUS
  Administered 2016-06-20: 2 [IU] via SUBCUTANEOUS
  Administered 2016-06-21: 3 [IU] via SUBCUTANEOUS
  Administered 2016-06-21: 5 [IU] via SUBCUTANEOUS
  Administered 2016-06-21 (×3): 3 [IU] via SUBCUTANEOUS

## 2016-06-15 MED ORDER — CHLORHEXIDINE GLUCONATE CLOTH 2 % EX PADS
6.0000 | MEDICATED_PAD | Freq: Every day | CUTANEOUS | Status: DC
  Administered 2016-06-15 – 2016-06-21 (×6): 6 via TOPICAL

## 2016-06-15 MED ORDER — SODIUM CHLORIDE 0.9% FLUSH
10.0000 mL | Freq: Two times a day (BID) | INTRAVENOUS | Status: DC
Start: 2016-06-15 — End: 2016-06-16

## 2016-06-15 MED ORDER — COLLAGENASE 250 UNIT/GM EX OINT
TOPICAL_OINTMENT | Freq: Every day | CUTANEOUS | Status: DC
  Administered 2016-06-15: 10:00:00 via TOPICAL
  Administered 2016-06-16 – 2016-06-17 (×2): 1 via TOPICAL
  Administered 2016-06-19 – 2016-06-21 (×3): via TOPICAL
  Filled 2016-06-15 (×2): qty 30

## 2016-06-15 MED ORDER — DARBEPOETIN ALFA 200 MCG/0.4ML IJ SOSY
200.0000 ug | PREFILLED_SYRINGE | INTRAMUSCULAR | Status: DC
  Administered 2016-06-15: 200 ug via SUBCUTANEOUS
  Filled 2016-06-15: qty 0.4

## 2016-06-15 MED ORDER — PRO-STAT SUGAR FREE PO LIQD
30.0000 mL | Freq: Every day | ORAL | Status: DC
  Administered 2016-06-15 – 2016-06-18 (×12): 30 mL
  Filled 2016-06-15 (×14): qty 30

## 2016-06-15 MED ORDER — SODIUM CHLORIDE 0.9 % IV SOLN
Freq: Once | INTRAVENOUS | Status: AC
  Administered 2016-06-15: 21:00:00 via INTRAVENOUS

## 2016-06-15 MED ORDER — VITAL AF 1.2 CAL PO LIQD
1000.0000 mL | ORAL | Status: DC
  Administered 2016-06-15 – 2016-06-19 (×6): 1000 mL

## 2016-06-15 MED ORDER — SODIUM CHLORIDE 0.9% FLUSH: 10.0000 mL | INTRAVENOUS | Status: DC | PRN

## 2016-06-15 NOTE — Progress Notes (Signed)
This note also relates to the following rows which could not be included: SpO2 - Cannot attach notes to unvalidated device data  Pt keeps popping off vent.  RT placed pt on SBT 5/5.  Pt tolerated well.  RT placed pt on ATC 10L, 35%.  Pt tolerating it well.

## 2016-06-15 NOTE — Consult Note (Signed)
[place] Nurse wound consult note Reason for Consult: Consult requested for trach, PEG and sacrum Wound type: Trach site with generalized redness, no open wound.  Suture to left trach.  No topical care is needed for trach site except standard split thickness gauze, changed PRN to absorb drainage. Please remove suture as soon as possible to avoid creating a wound. PEG tube with generalized erythremia surrounding the insertion site, no open wounds.  Barrier cream to protect skin and repel moisture. There is a medical device which has sutures next to the PEG site. Sacrum with previous deep tissue injury located in a deep crevasse,with dark purple skin to edges; beginning to evolve into unstageable wound in the center with slough.  3X2X.2cm, small amt tan drainage. Lower sacrum/Upper buttocks area with patchy areas of deep tissue injuries; affected area 4X4cm, dark purple, no open wounds or drainage. Pressure Injury POA: Yes Dressing procedure/placement/frequency: Pt is on a low airloss bed to reduce pressure.  Santyl for enzymatic debridement of nonviable tissue to sacrum.  Foam dressing to protect from further injury.  No family members at the bedside to discuss plan of care and pt is on the vent. Please re-consult if further assistance is needed.  Thank-you,  Julien Girt MSN, [place], [place], [place], [place]

## 2016-06-15 NOTE — Progress Notes (Signed)
PULMONARY / CRITICAL CARE MEDICINE   Name: Bryan Frank MRN: [id number] DOB: 1945/02/20    ADMISSION DATE:  06/14/2016 CONSULTATION DATE:  6/5  REFERRING MD:  [facility]  CHIEF COMPLAINT:  Renal failure  HISTORY OF PRESENT ILLNESS:   71 year old male with PMH as below, which is significant for DM2, HLD, GERD, HTN, and tobacco abuse. It appears as though he was admitted to [place], [place] hospital 04/2016 for severe COPD exacerbation and dense pneumonia. Also concern for UTI and treated with broad spectrum antibiotics. He improved and was transferred to [facility] in [place] 4/19. At that time he was on supplemental O2 during the day and BiPAP at night. On 4/21 his condition worsened requiring intubation. Likely due to severe COPD and possible PNA. He was unable to wean and had tracheostomy placed 4/27. He was found to have bilateral pleural effusions and is s/p pleurex catheter and thoracentesis. Cytology so far negative. He also required HD for worsening renal failure. This continued to worsen. Course also complicated by copious respiratory secretions. 6/5 he was transferred to [facility] at family request for further care and continued hemodialysis.   SUBJECTIVE / interval events:  Patient has been experiencing hematuria since Foley catheter change 6/5  VITAL SIGNS: BP (!) 137/54   Pulse 94   Temp (!) 96 F (35.6 C) (Oral) Comment: warmer applied through CRRT  Resp 17   Ht 5\' 9"  (1.753 m)   Wt 79.5 kg (175 lb 4.3 oz)   SpO2 100%   BMI 25.88 kg/m   HEMODYNAMICS:    VENTILATOR SETTINGS: Vent Mode: PRVC FiO2 (%):  [35 %-40 %] 35 % Set Rate:  [14 bmp-16 bmp] 14 bmp Vt Set:  [500 mL] 500 mL PEEP:  [5 cmH20] 5 cmH20 Plateau Pressure:  [15 cmH20] 15 cmH20  INTAKE / OUTPUT: I/O last 3 completed shifts: In: 681 [I.V.:130; IV Piggyback:551] Out: 3979 [Urine:3215; Other:764]  PHYSICAL EXAMINATION: General:  Elderly man, ill-appearing, no distress, currently on trach  collar Neuro:  Opened his eyes to voice, tracks, followed some simple commands, nodded to questions HEENT:  No oral lesions, pupils equal and reactive, trach in place with one suture present Cardiovascular:  Irregular, no murmur Lungs:  Distant, no wheezing, somewhat coarse Abdomen: Soft, nondistended, some erythema around the PEG site, positive bowel sounds Musculoskeletal:  No deformities Skin: Several decubitus ulcer with some surrounding purple necrotic skin  LABS:  BMET  Recent Labs Lab 06/14/16 1956 06/15/16 0208  NA 133* 133*  K 4.4 4.0  CL 94* 95*  CO2 25 26  BUN 160* 123*  CREATININE 2.48* 1.93*  GLUCOSE 229* 247*    Electrolytes  Recent Labs Lab 06/14/16 1956 06/15/16 0208  CALCIUM 7.4* 7.6*  MG 2.0 2.0  PHOS 5.6* 4.7*    CBC  Recent Labs Lab 06/14/16 1956 06/15/16 0208  WBC 24.8* 26.7*  HGB 7.3* 7.5*  HCT 21.4* 23.6*  PLT 274 275    Coag's  Recent Labs Lab 06/14/16 1956 06/15/16 0208  APTT  --  39*  INR 1.22  --     Sepsis Markers  Recent Labs Lab 06/14/16 1956 06/14/16 2150 06/15/16 0208  LATICACIDVEN 0.8  --   --   PROCALCITON  --  1.59 0.93    ABG  Recent Labs Lab 06/14/16 2006  PHART 7.424  PCO2ART 40.2  PO2ART 74.0*    Liver Enzymes  Recent Labs Lab 06/14/16 1956 06/15/16 0208  AST 101*  --  ALT 88*  --   ALKPHOS 302*  --   BILITOT 0.9  --   ALBUMIN 1.5* 1.5*    Cardiac Enzymes No results for input(s): TROPONINI, PROBNP in the last 168 hours.  Glucose  Recent Labs Lab 06/14/16 1931 06/15/16 0043 06/15/16 0354 06/15/16 0734  GLUCAP 201* 249* 212* 135*    Imaging Ct Head Wo Contrast  Result Date: 06/14/2016 CLINICAL DATA:  Altered mental status EXAM: CT HEAD WITHOUT CONTRAST TECHNIQUE: Contiguous axial images were obtained from the base of the skull through the vertex without intravenous contrast. COMPARISON:  None. FINDINGS: Brain: No mass lesion, intraparenchymal hemorrhage or extra-axial  collection. No evidence of acute cortical infarct. There is periventricular hypoattenuation compatible with chronic microvascular disease. There is diffuse atrophy without lobar predominance. Vascular: Atherosclerotic calcification of the vertebral and internal carotid arteries at the skull base. Skull: Normal visualized skull base, calvarium and extracranial soft tissues. Sinuses/Orbits: No sinus fluid levels or advanced mucosal thickening. No mastoid effusion. Normal orbits. IMPRESSION: Chronic microvascular ischemia without acute intracranial abnormality. Electronically Signed   By: Ulyses Jarred M.D.   On: 06/14/2016 21:20   Dg Chest Port 1 View  Result Date: 06/14/2016 CLINICAL DATA:  Pneumonia. EXAM: PORTABLE CHEST 1 VIEW COMPARISON:  None. FINDINGS: Patient is rotated to the left. Tracheostomy tube appears in adequate position. There is a right IJ central venous catheter with tip just below the cavoatrial junction. Lungs are adequately inflated with opacification over the left base which may be due to effusion and atelectasis although cannot exclude infection. Mild cardiomegaly. Calcified plaque is present over the aortic arch. There is minimal degenerate change of the spine. IMPRESSION: Left base opacification which may be due to atelectasis and effusion versus infection. Follow-up PA and lateral chest x-ray would be helpful for further evaluation. Cardiomegaly. Aortic atherosclerosis. Tubes and lines as described. Electronically Signed   By: Marin Olp M.D.   On: 06/14/2016 20:08     STUDIES:  Ct head 6/5 >>> chronic microvascular ischemic changes without acute finding  CULTURES: resp cx 5/31 >> Klebsiella (pansensitive), pseudomonas (pan resistant, intermediate to ceftazidime, sensitive to aminoglycoside); Corynebacterium  Respiratory aspirate 6/3>> Klebsiella (pansensitive), pseudomonas (pan resistant, intermediate to ceftazidime, sensitive to aminoglycoside); Corynebacterium  Tracheal  aspirate 6/5 >> rare GNR, rare GPC, >>  Blood culture 6/5 >>  Urine 6/5 >>   ANTIBIOTICS: Cefepime 5/29 >6/5 Ceftaz 6/5 > Levaquin 6/5 > Vancomycin 6/5 >  SIGNIFICANT EVENTS: Admit to [facility] 4/19 [facility] to [facility]  LINES/TUBES: Trach 4/27 > PEG 4/27 > Tunneled RIJ HD cath 6/5 >  DISCUSSION:   ASSESSMENT / PLAN:  PULMONARY A: Acute on chronic hypoxemic and hypercarbic respiratory failure. Severe COPD with +/- exacerbation Pulmonary edema  HCAP - cultures pseudomonas and corynebacterium (no sens done) Bilateral pleural effusions  P:   Ventilator dependent since 4/21. Continue PRVC 8cc/kg and push for weaning if able to tolerate > PSV and ATC Follow chest x-ray VAP prevention order set Scheduled DuoNeb, scheduled Pulmicort nebs   CARDIOVASCULAR A:  CHF, unspecified Hyperlipidemia Atrial fibrillation with controlled rate (? Chronic)  P:  Echocardiogram ordered, pending Continue telemetry, hemodynamic monitoring Digoxin currently on hold Catapres, isosorbide on hold Hold aspirin and systemic anticoagulation while he's having hematuria   RENAL A:   Recurrent (required HD 4/21 - 06/01/16 at [facility]) acute renal failure, serologies negative for glomerulonephritis, likely ATN Neurogenic bladder Hyponatremia  P:   Appreciate nephrology assistance Continue CVVHD per their recommendations Hopefully he will  achieve some renal recovery, currently nonoliguric Follow BMP, urine output  GASTROINTESTINAL A:   GERD  P:   Protonix as ordered Tube feeding Per nutrition recommendations   HEMATOLOGIC A:   ? Malignant lesion of sphenoid skull  P:  Head CT reassuring, no evidence of lesion on the skull base, calvarium Follow CBC, coags intermittently  GENITOURINARY:  A:  Hematuria, suspect traumatic after Foley change  P: Maintaining urine output currently. May need to consider three-way catheter for bladder irrigation or urology  consultation.  INFECTIOUS A:   HCAP - cultures pseudomonas, klebsiella, and corynebacterium (no sens done). Has been worsening despite treatment with cefepime Likely UTI Sacral decub PEG site erythema   P:   Broad-spectrum antibiotics initiated: Ceftaz,, Levaquin, vancomycin Note pseudomonas multidrug resistant. May need to consider tobramycin nebs Follow culture data and tailor antibiotics accordingly Follow pro-calcitonin (0.93) Wound care consultation - appreciate recommendations. Santyl debridement, foam dressing  ENDOCRINE A:   DM  P:   Sliding scale insulin order set  NEUROLOGIC A:  Acute encephalopathy, etiology unclear but suspect multifactorial: Medications, sepsis, hypercapnia, progressive renal dysfunction  P:   RASS goal: 0 Follow mental status for improvement with correction of his metabolic status, treatment of infection. If he does not rebound then we will need to consider more evaluation including possibly EEG, MRI brain. Low suspicion meningitis but would consider LP as well.   FAMILY  - Updates: No family present  - Inter-disciplinary family meet or Palliative Care meeting due by:  6/12   Independent CC time 92  minutes  Baltazar Apo, MD, PhD 06/15/2016, 11:27 AM [facility] [phone] or if no answer [phone]

## 2016-06-15 NOTE — Progress Notes (Signed)
[facility] Rounding Note  Subjective/Interval History:  Transferred from [facility] yesterday. PMH DM, HTN, HLD, GERD. Origin [place] c/COPD/PNA/UTI. Baseline creatinine April 2018 0.5. To [facility] 04/28/16. Eventual trach 2/2 unable to wean. G-tube. Bilateral effusions requiring Pleurx tube/thoracentesis. AKI preceded [facility] transfer. Exhaustive serologic evaluation neg for GN. HD dependent (started at [facility]) 04/30/16-06/01/16, recovered function (creatinine down to 1.3 on 5/24)/TDC removed, then recurrent AKI in setting of HCAP(pseudomonas and corynepbacterium)/bilateral effusions. BUN/creatinine up to 160/2.48. TDC replaced, pt transferred to [facility] for further care/HD. Family meeting at [place] 6/4 - wanted all done. CRRT started on arrival to [facility] 06/14/16.  Noted to have large drop in Hb at [facility] prior to transfer (11->8) No imaging done there  Opens eyes, does not follow commands No CRRT issues so far using TDC All 4K fluids/keeping even Development of gross hematuria with clots Has had no imaging done    Objective Vital signs in last 24 hours: Vitals:   06/15/16 0630 06/15/16 0645 06/15/16 0700 06/15/16 0747  BP: (!) 144/66 134/68 (!) 167/64   Pulse: 81 80 81   Resp: '15 17 17   ' Temp:    (!) 96 F (35.6 C)  TempSrc:    Oral  SpO2: 100% 100% 100%   Weight:      Height:       Weight change:   Intake/Output Summary (Last 24 hours) at 06/15/16 0752 Last data filed at 06/15/16 0700  Gross per 24 hour  Intake              681 ml  Output             3979 ml  Net            -3298 ml   Physical Exam:  Blood pressure (!) 167/64, pulse 81, temperature (!) 96 F (35.6 C), temperature source Oral, resp. rate 17, height '5\' 9"'  (1.753 m), weight 79.5 kg (175 lb 4.3 oz), SpO2 100 %.  Opens eyes, not following commands (am told this is better - was completely unresponsive on arrival) Hands mittened TDC/GTube/trach/foley Ant coarse BS Abd with GTube with some purulent drainage Some thigh  edema, no other LE edema Grossly bloody urine in foley with clots   Recent Labs Lab 06/14/16 1956 06/15/16 0208  NA 133* 133*  K 4.4 4.0  CL 94* 95*  CO2 25 26  GLUCOSE 229* 247*  BUN 160* 123*  CREATININE 2.48* 1.93*  CALCIUM 7.4* 7.6*  PHOS 5.6* 4.7*    Recent Labs Lab 06/14/16 1956 06/15/16 0208  AST 101*  --   ALT 88*  --   ALKPHOS 302*  --   BILITOT 0.9  --   PROT 6.2*  --   ALBUMIN 1.5* 1.5*    Recent Labs Lab 06/14/16 1956 06/15/16 0208  WBC 24.8* 26.7*  NEUTROABS 22.7*  --   HGB 7.3* 7.5*  HCT 21.4* 23.6*  MCV 92.2 91.5  PLT 274 275    Recent Labs Lab 06/14/16 1931 06/15/16 0043 06/15/16 0354 06/15/16 0734  GLUCAP 201* 249* 212* 135*   ABG    Component Value Date/Time   PHART 7.424 06/14/2016 2006   PCO2ART 40.2 06/14/2016 2006   PO2ART 74.0 (L) 06/14/2016 2006   HCO3 26.3 06/14/2016 2006   TCO2 28 06/14/2016 2006   O2SAT 95.0 06/14/2016 2006   Results for Bryan Frank (MRN [id number]) as of 06/15/2016 08:01  Ref. Range 06/14/2016 23:18  Appearance Latest Ref Range: CLEAR  CLOUDY (A)  Bacteria,  UA Latest Ref Range: NONE SEEN  MANY (A)  Bilirubin Urine Latest Ref Range: NEGATIVE  NEGATIVE  Color, Urine Latest Ref Range: YELLOW  RED (A)  Glucose Latest Ref Range: NEGATIVE mg/dL NEGATIVE  Hgb urine dipstick Latest Ref Range: NEGATIVE  LARGE (A)  Ketones, ur Latest Ref Range: NEGATIVE mg/dL NEGATIVE  Leukocytes, UA Latest Ref Range: NEGATIVE  LARGE (A)  Nitrite Latest Ref Range: NEGATIVE  NEGATIVE  pH Latest Ref Range: 5.0 - 8.0  8.0  Protein Latest Ref Range: NEGATIVE mg/dL 100 (A)  RBC / HPF Latest Ref Range: 0 - 5 RBC/hpf TOO NUMEROUS TO C...  Specific Gravity, Urine Latest Ref Range: 1.005 - 1.030  1.011  Squamous Epithelial / LPF Latest Ref Range: NONE SEEN  NONE SEEN  WBC, UA Latest Ref Range: 0 - 5 WBC/hpf TOO NUMEROUS TO C...   Blood cultures pending Urine culture - pending  Tracheal aspirate culture  pending  Studies/Results: Ct Head Wo Contrast  Result Date: 06/14/2016 CLINICAL DATA:  Altered mental status EXAM: CT HEAD WITHOUT CONTRAST TECHNIQUE: Contiguous axial images were obtained from the base of the skull through the vertex without intravenous contrast. COMPARISON:  None. FINDINGS: Brain: No mass lesion, intraparenchymal hemorrhage or extra-axial collection. No evidence of acute cortical infarct. There is periventricular hypoattenuation compatible with chronic microvascular disease. There is diffuse atrophy without lobar predominance. Vascular: Atherosclerotic calcification of the vertebral and internal carotid arteries at the skull base. Skull: Normal visualized skull base, calvarium and extracranial soft tissues. Sinuses/Orbits: No sinus fluid levels or advanced mucosal thickening. No mastoid effusion. Normal orbits. IMPRESSION: Chronic microvascular ischemia without acute intracranial abnormality. Electronically Signed   By: Ulyses Jarred M.D.   On: 06/14/2016 21:20   Dg Chest Port 1 View  Result Date: 06/14/2016 CLINICAL DATA:  Pneumonia. EXAM: PORTABLE CHEST 1 VIEW COMPARISON:  None. FINDINGS: Patient is rotated to the left. Tracheostomy tube appears in adequate position. There is a right IJ central venous catheter with tip just below the cavoatrial junction. Lungs are adequately inflated with opacification over the left base which may be due to effusion and atelectasis although cannot exclude infection. Mild cardiomegaly. Calcified plaque is present over the aortic arch. There is minimal degenerate change of the spine. IMPRESSION: Left base opacification which may be due to atelectasis and effusion versus infection. Follow-up PA and lateral chest x-ray would be helpful for further evaluation. Cardiomegaly. Aortic atherosclerosis. Tubes and lines as described. Electronically Signed   By: Marin Olp M.D.   On: 06/14/2016 20:08   Medications: . sodium chloride 250 mL (06/14/16 2221)  .  sodium chloride 250 mL (06/15/16 0000)  . cefTAZidime (FORTAZ)  IV Stopped (06/14/16 2250)  . levofloxacin (LEVAQUIN) IV    . dialysis replacement fluid (prismasate) 200 mL/hr at 06/14/16 2152  . dialysis replacement fluid (prismasate) 300 mL/hr at 06/14/16 2155  . dialysate (PRISMASATE) 2,000 mL/hr at 06/15/16 0323  . sodium chloride    . vancomycin Stopped (06/15/16 0100)   . budesonide (PULMICORT) nebulizer solution  0.5 mg Nebulization BID  . chlorhexidine gluconate (MEDLINE KIT)  15 mL Mouth Rinse BID  . Chlorhexidine Gluconate Cloth  6 each Topical Daily  . insulin aspart  0-15 Units Subcutaneous Q4H  . ipratropium-albuterol  3 mL Nebulization Q6H  . mouth rinse  15 mL Mouth Rinse QID  . midazolam  2 mg Intravenous Once  . pantoprazole (PROTONIX) IV  40 mg Intravenous QHS  . sodium chloride flush  10-40 mL  Intracatheter Q12H  . sodium chloride flush  3 mL Intravenous Q12H   Background:  PMH DM, HTN, HLD, GERD. Origin [place] c/COPD/PNA/UTI. Baseline creatinine April 2018 0.5. To [facility] 04/28/16. Eventual trach 2/2 unable to wean. G-tube. Bilateral effusions requiring Pleurx tube/thoracentesis. AKI preceded [facility] transfer. Exhaustive serologic evaluation neg for GN after arrival to [facility] (short of renal bx). HD dependent 04/30/16-06/01/16, creatinine down to 1.3 on 5/24, TDC removed Then recurrent AKI in setting of HCAP(pseudomonas and corynebacterium)/bilateral effusions/poss UTI/presumed sepsis. BUN/creatinine up to 160/2.48. TDC replaced, pt transferred to [facility] for further care/HD. Family meeting at [place] 6/4 - wanted all done. CRRT started on arrival to [facility] 06/14/16.   1. AKI - baseline creatinine 0.5 prior to April 2018. AKI with "exhaustive workup short of renal biopsy" while at [facility]. HD dependent 04/30/16-06/01/16 with creatinine nadir at [facility] 1.3, [facility] removed, then recurrent AKI, progressive since 5/31, likely sepsis related,  transferred with BUN/Cr 160/2.48.TDC  replaced R IJ prior to transfer. Currently receiving CRRT for gentle correction of numbers without any volume removal at this time. Making urine 2. Hyperkalemia - correcting (had kayexalate prior to transfer) 3. Gross hematuria - occurred when foley changed out. Has had microhematuria at [facility]. At some point probably needs imaging study. I'd like to at least [place] his kidneys 4. VDRF/trach 5. ID: HCAP - cultures + CRE pseudomonas, corynebacterium. Current ATB's V/F/levaquin. Did have sensitivities done at [facility] - have passed that info along to pharmacy/CCM. PEG site drainage. Possible UTI 6. HTN 7. PAF - dig has been stopped. (not good agent w/fluctuating GFR) 8. Anemia - Hb 7's. Had been on Aranesp at [place]. Resume.  9. Contact isolation - CRE pseudomonas at [facility] 10. Encephalopathy - eyes open, more alert than on arrival,  not following any commands.     Bryan Maes, MD [facility] [phone] pager 06/15/2016, 7:52 AM

## 2016-06-15 NOTE — Progress Notes (Signed)
Pt noted to have significant blood red urine with large clots at 2300 and 0400. Elink notified both these times and aware. See MD note.  Pt is alert and nodding to questions. Does not appear in distress. BP and other vitals stable. Will continue to monitor.

## 2016-06-15 NOTE — Progress Notes (Signed)
CRITICAL VALUE ALERT  Critical Value:  Hgb 6.4  Date & Time Notied:  06/15/16 1702  Provider Notified: Margaree Mackintosh MD Janann Colonel  Orders Received/Actions taken: to return page

## 2016-06-15 NOTE — Progress Notes (Signed)
PHARMACY - PHYSICIAN COMMUNICATION CRITICAL VALUE ALERT - BLOOD CULTURE IDENTIFICATION (BCID)  Results for orders placed or performed during the hospital encounter of 06/14/16  Blood Culture ID Panel (Reflexed) (Collected: 06/14/2016  7:40 PM)  Result Value Ref Range   Enterococcus species NOT DETECTED NOT DETECTED   Listeria monocytogenes NOT DETECTED NOT DETECTED   Staphylococcus species DETECTED (A) NOT DETECTED   Staphylococcus aureus NOT DETECTED NOT DETECTED   Methicillin resistance DETECTED (A) NOT DETECTED   Streptococcus species NOT DETECTED NOT DETECTED   Streptococcus agalactiae NOT DETECTED NOT DETECTED   Streptococcus pneumoniae NOT DETECTED NOT DETECTED   Streptococcus pyogenes NOT DETECTED NOT DETECTED   Acinetobacter baumannii NOT DETECTED NOT DETECTED   Enterobacteriaceae species NOT DETECTED NOT DETECTED   Enterobacter cloacae complex NOT DETECTED NOT DETECTED   Escherichia coli NOT DETECTED NOT DETECTED   Klebsiella oxytoca NOT DETECTED NOT DETECTED   Klebsiella pneumoniae NOT DETECTED NOT DETECTED   Proteus species NOT DETECTED NOT DETECTED   Serratia marcescens NOT DETECTED NOT DETECTED   Haemophilus influenzae NOT DETECTED NOT DETECTED   Neisseria meningitidis NOT DETECTED NOT DETECTED   Pseudomonas aeruginosa NOT DETECTED NOT DETECTED   Candida albicans NOT DETECTED NOT DETECTED   Candida glabrata NOT DETECTED NOT DETECTED   Candida krusei NOT DETECTED NOT DETECTED   Candida parapsilosis NOT DETECTED NOT DETECTED   Candida tropicalis NOT DETECTED NOT DETECTED    Name of physician (or Provider) Contacted: [facility], RN (Dr. Oletta Darter)  Changes to prescribed antibiotics required:  No change   Zaraya Delauder D. Mina Marble, PharmD, BCPS Pager:  [phone] 06/15/2016, 7:35 PM

## 2016-06-15 NOTE — Progress Notes (Signed)
Initial Nutrition Assessment  INTERVENTION:   Vital AF 1.2 @ 50 ml/hr (1200 ml/day) 30 ml Prostat five times per day Provides: 1940 kcal, 165 grams protein, and 973 ml free water.   NUTRITION DIAGNOSIS:   Inadequate oral intake related to inability to eat as evidenced by NPO status.  GOAL:   Patient will meet greater than or equal to 90% of their needs  MONITOR:   TF tolerance, Skin, I & O's, Labs  REASON FOR ASSESSMENT:   Consult Enteral/tube feeding initiation and management  ASSESSMENT:   Pt with PMH of COPD and DM who was admitted 4/18 through [place] for severe COPD tx to [facility], worsened and required intubation 4/21 likely due to severe COPD and possible PNA, trach placed 4/27, developed bilateral pleural effusions and is s/p pleurex catheter and thoracentesis. Pt has required HD for worsening renal failure on HD 4/21-5/23. Pt transferred to [facility] for further care.    CRRT started 6/5. Per nephrology plan for gentle correction without volume removal. Renal failure likely from sepsis from UTI.  UOP 3215 ml, no edema  Pt with hx of trach/PEG currently weaning on trach collar. Per CCM notes pt has been vent dependent since 4/21 MV: 7.5 L/min Temp (24hrs), Avg:96.9 F (36.1 C), Min:95.4 F (35.2 C), Max:98.6 F (37 C)  Discussed with RN Labs reviewed: Na 133 (L), BUN 123 (H), PO4 4.7 (H) CBG's: 212-135-112  Exam indicates bed bound status Pt opens eyes but does not respond.   Diet Order:  Diet NPO time specified  Skin:   ( unstageable wound on sacrum, MASD perineum, multiple areas of DTI to upper buttocks)  Last BM:  6/6  Height:   Ht Readings from Last 1 Encounters:  06/15/16 5\' 9"  (1.753 m)    Weight:   Wt Readings from Last 1 Encounters:  06/15/16 175 lb 4.3 oz (79.5 kg)    Ideal Body Weight:  72.7 kg  BMI:  Body mass index is 25.88 kg/m.  Estimated Nutritional Needs:   Kcal:  1900  Protein:  >160 grams  Fluid:  >1.9 L/day  EDUCATION NEEDS:    No education needs identified at this time  [place], [place], [place] Pager [phone] After Hours Pager

## 2016-06-15 NOTE — Progress Notes (Signed)
[facility] Progress Note Patient Name: Bryan Frank DOB: [birth year] MRN: [id number]   Date of Service  06/15/2016  HPI/Events of Note  Anemia - Hgb = 6.4.  ICU Interventions  Will transfuse 1 unit PRBC.     Intervention Category Intermediate Interventions: Other:  Sommer,Steven Cornelia Copa 06/15/2016, 5:15 PM

## 2016-06-16 LAB — CULTURE, BLOOD (ROUTINE X 2): Special Requests: ADEQUATE

## 2016-06-16 LAB — BLOOD GAS, ARTERIAL
Acid-Base Excess: 4.2 mmol/L — ABNORMAL HIGH (ref 0.0–2.0)
BICARBONATE: 28.5 mmol/L — AB (ref 20.0–28.0)
Drawn by: 23588
FIO2: 28
O2 Saturation: 92.3 %
PATIENT TEMPERATURE: 98.4
PCO2 ART: 44.2 mmHg (ref 32.0–48.0)
PH ART: 7.424 (ref 7.350–7.450)
PO2 ART: 62.1 mmHg — AB (ref 83.0–108.0)

## 2016-06-16 LAB — RENAL FUNCTION PANEL
ALBUMIN: 1.6 g/dL — AB (ref 3.5–5.0)
Albumin: 1.6 g/dL — ABNORMAL LOW (ref 3.5–5.0)
Anion gap: 8 (ref 5–15)
Anion gap: 8 (ref 5–15)
BUN: 40 mg/dL — ABNORMAL HIGH (ref 6–20)
BUN: 48 mg/dL — ABNORMAL HIGH (ref 6–20)
CHLORIDE: 104 mmol/L (ref 101–111)
CO2: 27 mmol/L (ref 22–32)
CO2: 28 mmol/L (ref 22–32)
CREATININE: 0.73 mg/dL (ref 0.61–1.24)
CREATININE: 0.83 mg/dL (ref 0.61–1.24)
Calcium: 7.8 mg/dL — ABNORMAL LOW (ref 8.9–10.3)
Calcium: 7.6 mg/dL — ABNORMAL LOW (ref 8.9–10.3)
Chloride: 103 mmol/L (ref 101–111)
GFR calc non Af Amer: >60 mL/min (ref >60)
Glucose, Bld: 181 mg/dL — ABNORMAL HIGH (ref 65–99)
Glucose, Bld: 220 mg/dL — ABNORMAL HIGH (ref 65–99)
PHOSPHORUS: 2.1 mg/dL — AB (ref 2.5–4.6)
POTASSIUM: 4.1 mmol/L (ref 3.5–5.1)
Phosphorus: 1.9 mg/dL — ABNORMAL LOW (ref 2.5–4.6)
Potassium: 3.7 mmol/L (ref 3.5–5.1)
SODIUM: 138 mmol/L (ref 135–145)
Sodium: 140 mmol/L (ref 135–145)

## 2016-06-16 LAB — CBC
HCT: 24.9 % — ABNORMAL LOW (ref 39.0–52.0)
HEMATOCRIT: 23.8 % — AB (ref 39.0–52.0)
HEMOGLOBIN: 7.6 g/dL — AB (ref 13.0–17.0)
Hemoglobin: 8.5 g/dL — ABNORMAL LOW (ref 13.0–17.0)
MCH: 32.8 pg (ref 26.0–34.0)
MCH: 29.2 pg (ref 26.0–34.0)
MCHC: 34.1 g/dL (ref 30.0–36.0)
MCHC: 31.9 g/dL (ref 30.0–36.0)
MCV: 96.1 fL (ref 78.0–100.0)
MCV: 91.5 fL (ref 78.0–100.0)
Platelets: 303 10*3/uL (ref 150–400)
Platelets: 320 10*3/uL (ref 150–400)
RBC: 2.59 MIL/uL — ABNORMAL LOW (ref 4.22–5.81)
RBC: 2.6 MIL/uL — ABNORMAL LOW (ref 4.22–5.81)
RDW: 16.6 % — AB (ref 11.5–15.5)
RDW: 16.9 % — ABNORMAL HIGH (ref 11.5–15.5)
WBC: 23 10*3/uL — ABNORMAL HIGH (ref 4.0–10.5)
WBC: 24.6 10*3/uL — ABNORMAL HIGH (ref 4.0–10.5)

## 2016-06-16 LAB — GLUCOSE, CAPILLARY
GLUCOSE-CAPILLARY: 186 mg/dL — AB (ref 65–99)
GLUCOSE-CAPILLARY: 201 mg/dL — AB (ref 65–99)
GLUCOSE-CAPILLARY: 212 mg/dL — AB (ref 65–99)
Glucose-Capillary: 187 mg/dL — ABNORMAL HIGH (ref 65–99)
Glucose-Capillary: 235 mg/dL — ABNORMAL HIGH (ref 65–99)
Glucose-Capillary: 214 mg/dL — ABNORMAL HIGH (ref 65–99)

## 2016-06-16 LAB — CULTURE, RESPIRATORY W GRAM STAIN: Culture: NORMAL

## 2016-06-16 LAB — PHOSPHORUS: PHOSPHORUS: 2.1 mg/dL — AB (ref 2.5–4.6)

## 2016-06-16 LAB — URINE CULTURE

## 2016-06-16 LAB — MAGNESIUM
MAGNESIUM: 1.9 mg/dL (ref 1.7–2.4)
MAGNESIUM: 1.7 mg/dL (ref 1.7–2.4)

## 2016-06-16 LAB — CULTURE, RESPIRATORY

## 2016-06-16 LAB — PROCALCITONIN: PROCALCITONIN: 0.71 ng/mL

## 2016-06-16 MED ORDER — DEXTROSE 5 % IV SOLN
2.0000 g | INTRAVENOUS | Status: DC
  Administered 2016-06-16: 2 g via INTRAVENOUS
  Filled 2016-06-16: qty 2

## 2016-06-16 MED ORDER — LEVOFLOXACIN IN D5W 500 MG/100ML IV SOLN
500.0000 mg | INTRAVENOUS | Status: DC
  Administered 2016-06-17: 500 mg via INTRAVENOUS
  Filled 2016-06-16 (×2): qty 100

## 2016-06-16 MED ORDER — DEXTROSE 5 % IV SOLN: 1.0000 g | INTRAVENOUS | Status: DC

## 2016-06-16 MED ORDER — METOPROLOL TARTRATE 50 MG PO TABS
50.0000 mg | ORAL_TABLET | Freq: Two times a day (BID) | ORAL | Status: DC
  Filled 2016-06-16: qty 1

## 2016-06-16 MED ORDER — METOPROLOL TARTRATE 50 MG PO TABS
50.0000 mg | ORAL_TABLET | Freq: Two times a day (BID) | ORAL | Status: DC
  Administered 2016-06-16 – 2016-06-17 (×2): 50 mg
  Filled 2016-06-16 (×2): qty 1

## 2016-06-16 NOTE — Progress Notes (Signed)
Let CCM know about high HR into 140s. PTA BP meds not restarted at this time. CCM aware and stated they would re-order those and continue to monitor. Bartholomew Crews, RN 06/16/2016 12:51 PM

## 2016-06-16 NOTE — Care Management Note (Signed)
Case Management Note  Patient Details  Name: Bryan Frank MRN: [id number] Date of Birth: Jan 18, 1945  Subjective/Objective:                    Action/Plan:  PTA from [facility] (there for approximately 30 days).  Per liason - facility was having goals of care discussions with pt and family - requesting Palliative consult while admitted at [facility].  Attending gave verbal order for Palliative Consult.  Facility will accept pt back when medically stable   Expected Discharge Date:                  Expected Discharge Plan:  Long Term Acute Care (LTAC)  In-House Referral:     Discharge planning Services  CM Consult  Post Acute Care Choice:    Choice offered to:     DME Arranged:    DME Agency:     HH Arranged:    HH Agency:     Status of Service:     If discussed at [employer], dates discussed:    Additional Comments:  Maryclare Labrador, RN 06/16/2016, 11:44 AM

## 2016-06-16 NOTE — Progress Notes (Signed)
eLink Physician-Brief Progress Note Patient Name: Bryan Frank DOB: 04-01-1945 MRN: [id number]   Date of Service  06/16/2016  HPI/Events of Note  Hypertension - BP = 150/60 and HR = 133  eICU Interventions  Will restart Metoprolol 50 mg per tube now and BID.      Intervention Category Major Interventions: Hypertension - evaluation and management  Rigby Leonhardt Eugene 06/16/2016, 3:26 PM

## 2016-06-16 NOTE — Progress Notes (Signed)
[facility] Rounding Note  Subjective/Interval History:  Transferred from [place] 6/5. PMH DM, HTN, HLD, GERD. Origin [place] c/COPD/PNA/UTI. Baseline creatinine April 2018 0.5. To [facility] 04/28/16. Eventual trach 2/2 unable to wean. G-tube. Bilateral effusions requiring Pleurx tube/thoracentesis. AKI preceded [facility] transfer. Exhaustive serologic evaluation neg for GN. HD dependent (started at [facility]) 04/30/16-06/01/16, recovered function (creatinine down to 1.3 on 5/24)/TDC removed, then recurrent AKI in setting of HCAP(pseudomonas and corynepbacterium)/bilateral effusions. BUN/creatinine up to 160/2.48. TDC replaced, pt transferred to [facility] for further care/HD. Family meeting at [place] 6/4 - wanted all done. CRRT started on arrival to [facility] 06/14/16.  On CRRT here since PM of 6/5 No technical issues Have been keeping even Not oliguric - UOP 2.2 liters/24 hours  More alert, follows some simple commands, smiled at me when I told him "better"   Objective Vital signs in last 24 hours: Vitals:   06/16/16 0411 06/16/16 0500 06/16/16 0600 06/16/16 0700  BP:  138/73 (!) 135/56 (!) 143/60  Pulse:  93 95 (!) 111  Resp:  (!) 21 (!) 23 (!) 22  Temp: 97.3 F (36.3 C)     TempSrc: Axillary     SpO2:  100% 100% 99%  Weight:      Height:       Weight change: -4.3 kg (-9 lb 7.7 oz)  Intake/Output Summary (Last 24 hours) at 06/16/16 0721 Last data filed at 06/16/16 0700  Gross per 24 hour  Intake          1985.83 ml  Output             4100 ml  Net         -2114.17 ml   Physical Exam:  Blood pressure (!) 143/60, pulse (!) 111, temperature 97.3 F (36.3 C), temperature source Axillary, resp. rate (!) 22, height '5\' 9"'  (1.753 m), weight 75.9 kg (167 lb 5.3 oz), SpO2 99 %.   More responsive, follows some simple commands, smiled On trach collar Hands mittened TDC/GTube/trach/foley (still gross hematuria since foley switched out - purulent urine prior) Overall decreased muscle mass Ant coarse BS but  clear Abd with GTube mild redness at site Some thigh edema, no other LE edema Grossly bloody urine in foley with clots but some clearing in 24 hours TDC (6/5 [facility])   Recent Labs Lab 06/14/16 1956 06/15/16 0208 06/15/16 1640 06/16/16 0122  NA 133* 133* 137 138  K 4.4 4.0 3.3* 3.7  CL 94* 95* 101 103  CO2 '25 26 26 27  ' GLUCOSE 229* 247* 119* 181*  BUN 160* 123* 53* 40*  CREATININE 2.48* 1.93* 0.93 0.73  CALCIUM 7.4* 7.6* 7.6* 7.8*  PHOS 5.6* 4.7* 2.4* 2.1*  2.1*    Recent Labs Lab 06/14/16 1956 06/15/16 0208 06/15/16 1640 06/16/16 0122  AST 101*  --   --   --   ALT 88*  --   --   --   ALKPHOS 302*  --   --   --   BILITOT 0.9  --   --   --   PROT 6.2*  --   --   --   ALBUMIN 1.5* 1.5* 1.6* 1.6*    Recent Labs Lab 06/14/16 1956 06/15/16 0208 06/15/16 1640 06/16/16 0122  WBC 24.8* 26.7*  --  23.0*  NEUTROABS 22.7*  --   --   --   HGB 7.3* 7.5* 6.4* 8.5*  HCT 21.4* 23.6* 18.5* 24.9*  MCV 92.2 91.5  --  96.1  PLT 274 275  --  [place] Lab 06/15/16 1151 06/15/16 1608 06/15/16 2006 06/15/16 2319 06/16/16 0408  GLUCAP 112* 104* 138* 175* 187*   ABG    Component Value Date/Time   PHART 7.425 06/15/2016 1545   PCO2ART 41.7 06/15/2016 1545   PO2ART 66.6 (L) 06/15/2016 1545   HCO3 27.5 06/15/2016 1545   TCO2 28 06/14/2016 2006   O2SAT 95.0 06/15/2016 1545   Results for GLENN, GULLICKSON (MRN [id number]) as of 06/15/2016 08:01  Ref. Range 06/14/2016 23:18  Appearance Latest Ref Range: CLEAR  CLOUDY (A)  Bacteria, UA Latest Ref Range: NONE SEEN  MANY (A)  Bilirubin Urine Latest Ref Range: NEGATIVE  NEGATIVE  Color, Urine Latest Ref Range: YELLOW  RED (A)  Glucose Latest Ref Range: NEGATIVE mg/dL NEGATIVE  Hgb urine dipstick Latest Ref Range: NEGATIVE  LARGE (A)  Ketones, ur Latest Ref Range: NEGATIVE mg/dL NEGATIVE  Leukocytes, UA Latest Ref Range: NEGATIVE  LARGE (A)  Nitrite Latest Ref Range: NEGATIVE  NEGATIVE  pH Latest Ref Range: 5.0 - 8.0   8.0  Protein Latest Ref Range: NEGATIVE mg/dL 100 (A)  RBC / HPF Latest Ref Range: 0 - 5 RBC/hpf TOO NUMEROUS TO C...  Specific Gravity, Urine Latest Ref Range: 1.005 - 1.030  1.011  Squamous Epithelial / LPF Latest Ref Range: NONE SEEN  NONE SEEN  WBC, UA Latest Ref Range: 0 - 5 WBC/hpf TOO NUMEROUS TO C...   6/5 Blood cultures MRSA 1/2 6/5 Urine culture - multiple spp  6/5 Tracheal aspirate culture pending  Studies/Results: Ct Head Wo Contrast  Result Date: 06/14/2016 CLINICAL DATA:  Altered mental status EXAM: CT HEAD WITHOUT CONTRAST TECHNIQUE: Contiguous axial images were obtained from the base of the skull through the vertex without intravenous contrast. COMPARISON:  None. FINDINGS: Brain: No mass lesion, intraparenchymal hemorrhage or extra-axial collection. No evidence of acute cortical infarct. There is periventricular hypoattenuation compatible with chronic microvascular disease. There is diffuse atrophy without lobar predominance. Vascular: Atherosclerotic calcification of the vertebral and internal carotid arteries at the skull base. Skull: Normal visualized skull base, calvarium and extracranial soft tissues. Sinuses/Orbits: No sinus fluid levels or advanced mucosal thickening. No mastoid effusion. Normal orbits. IMPRESSION: Chronic microvascular ischemia without acute intracranial abnormality. Electronically Signed   By: Ulyses Jarred M.D.   On: 06/14/2016 21:20   US Renal  Result Date: 06/16/2016 CLINICAL DATA:  Hematuria. EXAM: RENAL / URINARY TRACT ULTRASOUND COMPLETE COMPARISON:  No prior. FINDINGS: Right Kidney: Length: 14.9 cm. Echogenicity within normal limits. Prominent column of Bertin. If symptoms persist CT can be performed . No mass or hydronephrosis visualized. Left Kidney: Length: 14.7 cm. Echogenicity within normal limits. No mass or hydronephrosis visualized. Bladder: Foley catheter bladder.  Bladder decompressed. IMPRESSION: No acute or focal abnormality identified. No  hydronephrosis. Bladder is decompressed by a Foley catheter. Electronically Signed   By: Marcello Moores  Register   On: 06/16/2016 06:16   Dg Chest Port 1 View  Result Date: 06/14/2016 CLINICAL DATA:  Pneumonia. EXAM: PORTABLE CHEST 1 VIEW COMPARISON:  None. FINDINGS: Patient is rotated to the left. Tracheostomy tube appears in adequate position. There is a right IJ central venous catheter with tip just below the cavoatrial junction. Lungs are adequately inflated with opacification over the left base which may be due to effusion and atelectasis although cannot exclude infection. Mild cardiomegaly. Calcified plaque is present over the aortic arch. There is minimal degenerate change of the spine. IMPRESSION: Left base opacification which may be due to  atelectasis and effusion versus infection. Follow-up PA and lateral chest x-ray would be helpful for further evaluation. Cardiomegaly. Aortic atherosclerosis. Tubes and lines as described. Electronically Signed   By: Marin Olp M.D.   On: 06/14/2016 20:08   Medications: . sodium chloride 250 mL (06/14/16 2221)  . sodium chloride Stopped (06/15/16 1100)  . cefTAZidime (FORTAZ)  IV Stopped (06/15/16 2148)  . feeding supplement (VITAL AF 1.2 CAL) 1,000 mL (06/15/16 1629)  . levofloxacin (LEVAQUIN) IV Stopped (06/15/16 2300)  . dialysis replacement fluid (prismasate) 200 mL/hr at 06/15/16 2345  . dialysis replacement fluid (prismasate) 300 mL/hr at 06/15/16 1510  . dialysate (PRISMASATE) 2,000 mL/hr at 06/16/16 0448  . sodium chloride    . vancomycin Stopped (06/16/16 0000)   . budesonide (PULMICORT) nebulizer solution  0.5 mg Nebulization BID  . chlorhexidine gluconate (MEDLINE KIT)  15 mL Mouth Rinse BID  . Chlorhexidine Gluconate Cloth  6 each Topical Daily  . collagenase   Topical Daily  . darbepoetin (ARANESP) injection - NON-DIALYSIS  200 mcg Subcutaneous Q Wed-1800  . feeding supplement (PRO-STAT SUGAR FREE 64)  30 mL Per Tube 5 X Daily  . insulin  aspart  0-15 Units Subcutaneous Q4H  . ipratropium-albuterol  3 mL Nebulization Q6H  . mouth rinse  15 mL Mouth Rinse QID  . midazolam  2 mg Intravenous Once  . pantoprazole (PROTONIX) IV  40 mg Intravenous QHS  . sodium chloride flush  10-40 mL Intracatheter Q12H  . sodium chloride flush  3 mL Intravenous Q12H   Background:  PMH DM, HTN, HLD, GERD. Origin [place] c/COPD/PNA/UTI. Baseline creatinine April 2018 0.5. To [facility] 04/28/16. Eventual trach 2/2 unable to wean. G-tube. Bilateral effusions requiring Pleurx tube/thoracentesis. AKI preceded [facility] transfer. Exhaustive serologic evaluation neg for GN after arrival to [facility] (short of renal bx). HD dependent 04/30/16-06/01/16, creatinine down to 1.3 on 5/24, TDC removed Then recurrent AKI in setting of HCAP(pseudomonas and corynebacterium)/bilateral effusions/poss UTI/presumed sepsis. BUN/creatinine up to 160/2.48. TDC replaced, pt transferred to [facility] for further care/HD. Family meeting at [place] 6/4 - wanted all done. CRRT started on arrival to [facility] 06/14/16.   1. AKI - baseline creatinine 0.5 prior to April 2018. AKI with "exhaustive workup short of renal biopsy" while at [facility]. HD dependent 04/30/16-06/01/16 with creatinine nadir at [facility] 1.3 on 5/24. TDC removed, then recurrent AKI, progressive since 5/31, likely sepsis related, transferred 6/5 with BUN/Cr 160/2.48.TDC replaced R IJ prior to transfer. CRRT initiated primarily d/t severe azotemia without volume removal. Non-oliguric. [place] 14.7, 14.9 cm kidneys, no masses or hydro. Labs markedly improved reflective of CRRT.  1. Stop CRRT 2. Trend labs  3. With low muscle mass creatinine not entirely predictive of GFR 4. Overall looks a little dry and may need additional fluids but will reassess off CRRT 2. Hyperkalemia - corrected. 3. Gross hematuria - occurred when foley changed out (purulent in foley prior to change). Nothing grossly on [place] 4. VDRF/trach - CCM managing. Trach collar  overnight 5. ID: HCAP ([facility] cultures + CRE pseudomonas, corynebacterium). Current ATB's V/F/levaquin. 6/5 BC+ MRSA 1/2. All other cx here still pending 6. HTN - normotensive currently 7. PAF - dig has been stopped. (not good agent w/fluctuating GFR and would look to other agents for control) 8. Anemia - Had been on Aranesp at [facility] (resumed).  Noted to have large drop in Hb at [facility] prior to transfer (11->8) and further declined to 6.4 yesterday requiring transfusion 1 unit. Gross hematuria, but no other  obvious sources blood loss 9. Contact isolation - CRE pseudomonas at [facility]; [facility] MRSA PCR + 10. Encephalopathy - lighter, follows a couple of simple commands. Baseline to me not known   Jamal Maes, MD [employer] [phone] pager 06/16/2016, 7:21 AM

## 2016-06-16 NOTE — Progress Notes (Signed)
PULMONARY / CRITICAL CARE MEDICINE   Name: Bryan Frank MRN: [id number] DOB: October 14, 1945    ADMISSION DATE:  06/14/2016 CONSULTATION DATE:  6/5  REFERRING MD:  [facility] MD  CHIEF COMPLAINT:  Renal failure  HISTORY OF PRESENT ILLNESS:   71 year old male with PMH as below, which is significant for DM2, HLD, GERD, HTN, and tobacco abuse. It appears as though he was admitted to [place], [place] hospital 04/2016 for severe COPD exacerbation and dense pneumonia. Also concern for UTI and treated with broad spectrum antibiotics. He improved and was transferred to [facility] in [place] 4/19. At that time he was on supplemental O2 during the day and BiPAP at night. On 4/21 his condition worsened requiring intubation. Likely due to severe COPD and possible PNA. He was unable to wean and had tracheostomy placed 4/27. He was found to have bilateral pleural effusions and is s/p pleurex catheter and thoracentesis. Cytology so far negative. He also required HD for worsening renal failure. This continued to worsen. Course also complicated by copious respiratory secretions. 6/5 he was transferred to [facility] at family request for further care and continued hemodialysis.   SUBJECTIVE / interval events:  Continues to have moderate hematuria, received 1 unit of blood last night Continues to make urine Mental status improving He tolerated trach collar all night   VITAL SIGNS: BP (!) 124/56 (BP Location: Right Arm)   Pulse 91   Temp 98.4 F (36.9 C) (Oral)   Resp 17   Ht 5\' 9"  (1.753 m)   Wt 75.9 kg (167 lb 5.3 oz)   SpO2 97%   BMI 24.71 kg/m   HEMODYNAMICS:    VENTILATOR SETTINGS: FiO2 (%):  [28 %-35 %] 28 %  INTAKE / OUTPUT: I/O last 3 completed shifts: In: 2666.8 [I.V.:400; Blood:340; Other:350; NG/GT:725.8; IV [id number]:734] Out: 1937 [Urine:5510; Other:2569]  PHYSICAL EXAMINATION: General:  Elderly man, ill-appearing, no distress, currently on trach collar Neuro:  Opens his  eyes, tracks followed commands, nods to some questions, moves bilateral upper extremities HEENT:  Pupils equal no oral lesions, tracheostomy in place with one suture present Cardiovascular:  Irregular, no murmur Lungs: Distant, coarse bilaterally, no wheezing Abdomen: Soft, benign, some erythema around the PEG site Musculoskeletal:  No deformity Skin: Sacral decubitus ulcer with some surrounding necrosis, dressed  LABS:  BMET  Recent Labs Lab 06/15/16 0208 06/15/16 1640 06/16/16 0122  NA 133* 137 138  K 4.0 3.3* 3.7  CL 95* 101 103  CO2 26 26 27   BUN 123* 53* 40*  CREATININE 1.93* 0.93 0.73  GLUCOSE 247* 119* 181*    Electrolytes  Recent Labs Lab 06/14/16 1956 06/15/16 0208 06/15/16 1640 06/16/16 0122  CALCIUM 7.4* 7.6* 7.6* 7.8*  MG 2.0 2.0  --  1.9  PHOS 5.6* 4.7* 2.4* 2.1*  2.1*    CBC  Recent Labs Lab 06/14/16 1956 06/15/16 0208 06/15/16 1640 06/16/16 0122  WBC 24.8* 26.7*  --  23.0*  HGB 7.3* 7.5* 6.4* 8.5*  HCT 21.4* 23.6* 18.5* 24.9*  PLT 274 275  --  303    Coag's  Recent Labs Lab 06/14/16 1956 06/15/16 0208  APTT  --  39*  INR 1.22  --     Sepsis Markers  Recent Labs Lab 06/14/16 1956 06/14/16 2150 06/15/16 0208 06/16/16 0122  LATICACIDVEN 0.8  --   --   --   PROCALCITON  --  1.59 0.93 0.71    ABG  Recent Labs Lab 06/14/16 2006 06/15/16  1545  PHART 7.424 7.425  PCO2ART 40.2 41.7  PO2ART 74.0* 66.6*    Liver Enzymes  Recent Labs Lab 06/14/16 1956 06/15/16 0208 06/15/16 1640 06/16/16 0122  AST 101*  --   --   --   ALT 88*  --   --   --   ALKPHOS 302*  --   --   --   BILITOT 0.9  --   --   --   ALBUMIN 1.5* 1.5* 1.6* 1.6*    Cardiac Enzymes No results for input(s): TROPONINI, PROBNP in the last 168 hours.  Glucose  Recent Labs Lab 06/15/16 1151 06/15/16 1608 06/15/16 2006 06/15/16 2319 06/16/16 0408 06/16/16 0734  GLUCAP 112* 104* 138* 175* 187* 186*    Imaging US Renal  Result Date:  06/16/2016 CLINICAL DATA:  Hematuria. EXAM: RENAL / URINARY TRACT ULTRASOUND COMPLETE COMPARISON:  No prior. FINDINGS: Right Kidney: Length: 14.9 cm. Echogenicity within normal limits. Prominent column of Bertin. If symptoms persist CT can be performed . No mass or hydronephrosis visualized. Left Kidney: Length: 14.7 cm. Echogenicity within normal limits. No mass or hydronephrosis visualized. Bladder: Foley catheter bladder.  Bladder decompressed. IMPRESSION: No acute or focal abnormality identified. No hydronephrosis. Bladder is decompressed by a Foley catheter. Electronically Signed   By: Marcello Moores  Register   On: 06/16/2016 06:16     STUDIES:  Ct head 6/5 >>> chronic microvascular ischemic changes without acute finding  CULTURES: resp cx 5/31 >> Klebsiella (pansensitive), pseudomonas (pan resistant, intermediate to ceftazidime, sensitive to aminoglycoside); Corynebacterium  Respiratory aspirate 6/3>> Klebsiella (pansensitive), pseudomonas (pan resistant, intermediate to ceftazidime, sensitive to aminoglycoside); Corynebacterium  Tracheal aspirate 6/5 >> rare GNR, rare GPC, >>  Blood culture 6/5 >> 1 of 2 coag-negative staph, likely MRSE based on PCR panel Urine 6/5 >> multiple species, suggests recollection  ANTIBIOTICS: Cefepime 5/29 >6/5 Ceftaz 6/5 > Levaquin 6/5 > Vancomycin 6/5 > 6/7  SIGNIFICANT EVENTS: Admit to [facility] 4/19 Admit to [facility] 6[facility]  LINES/TUBES: Trach 4/27 > PEG 4/27 > Tunneled RIJ HD cath 6/5 >  DISCUSSION:   ASSESSMENT / PLAN:  PULMONARY A: Acute on chronic hypoxemic and hypercarbic respiratory failure. Severe COPD with +/- exacerbation Pulmonary edema  HCAP - cultures pseudomonas and corynebacterium (no sens done) Bilateral pleural effusions  P:   Had require mechanical ventilation/tracheostomy since mid April. Push for trach collar as much as he can tolerate. Check ABG on 6/7 since he's been on trach collar over 24 hours Follow chest x-ray VAP  prevention order set Continue scheduled DuoNeb, scheduled Pulmicort neb   CARDIOVASCULAR A:  CHF, unspecified. Echocardiogram 6/6> LV EF 55-60%, mildly dilated LA, RA. Diastolic dysfunction. Normal RV size and function Hyperlipidemia Atrial fibrillation with controlled rate (? Chronic)  P:  Continue telemetry and hemodynamically monitoring Digoxin currently on hold Catapres, isosorbide on hold No anticoagulation or aspirin while he's having hematuria   RENAL A:   Recurrent (required HD 4/21 - 06/01/16 at [facility]) acute renal failure, serologies negative for glomerulonephritis, likely ATN Neurogenic bladder Hyponatremia  P:   Appreciate nephrology assistance Planning to stop CVVHD today 6/7 Follow urine output, serum creatinine, BMP. Hopefully he will have renal recovery   GASTROINTESTINAL A:   GERD  P:   Protonix as ordered Tube feeding  HEMATOLOGIC A:   ? Malignant lesion of sphenoid skull, not seen on CT head  P:  Head CT reassuring, no evidence of lesion on the skull base, calvarium Follow CBC, coags intermittently  GENITOURINARY:  A:  Hematuria, suspect traumatic after Foley change  P: Continues to maintain urine output Follow CBC, required 1 unit of blood 6/6 May ultimately require bladder irrigation but hopefully can avoid  INFECTIOUS A:   HCAP - cultures pseudomonas, klebsiella, and corynebacterium (no sens done). Has been worsening despite treatment with cefepime Likely UTI, first culture multiple species  1 of 2 blood cultures MRSE, likely contaminant  Sacral decub PEG site erythema   P:   Stop vancomycin on 6/7, suspect that coag-negative staph is a contaminant Broad-spectrum antibiotics: Ceftaz,, Levaquin, Note pseudomonas is multidrug resistant, may need to consider tobramycin nebs, Consider ID consultation  Follow culture data Appreciate wound care consultation, recommendation. Santyl debridement, foam dressing  ENDOCRINE A:    DM  P:   Slightly scale insulin order set  NEUROLOGIC A:  Acute encephalopathy, etiology unclear but suspect multifactorial: Medications, sepsis, hypercapnia, progressive renal dysfunction  P:   RASS goal: 0 Improving mental status with correction of metabolic, infection. Following clinically No indication for further workup at this time   FAMILY  - Updates: No family present  - Inter-disciplinary family meet or Palliative Care meeting due by:  6/12   Independent CC time 88 minutes  Baltazar Apo, MD, PhD 06/16/2016, 9:25 AM [facility] [phone] or if no answer [phone]

## 2016-06-16 NOTE — Progress Notes (Addendum)
Pharmacy Antibiotic Note  Bryan Frank is a 71 y.o. male transferred to [facility] on 06/14/2016 due to acute renal failure.  Pharmacy has been consulted for fortaz, and levaquin dosing. Vancomycin has been discontinued.   Pt transferred from [facility] where he was being treated with cefepime for his HCAP. Pt then transitioned to broad spectrum antibiotics. Pt on CRRT 6/5 > 6/7. With pt coming off of CRRT will adjust dosing of abx accordingly.   See section below and paper chart for organisms and sensitivities from [facility].  Plan:  Ceftazidime 2g q24 hours Levaquin 500g q48 hours Follow renal function closely LOT plans  Height: 5\' 9"  (175.3 cm) Weight: 167 lb 5.3 oz (75.9 kg) IBW/kg (Calculated) : 70.7  Temp (24hrs), Avg:97.9 F (36.6 C), Min:97.2 F (36.2 C), Max:99.3 F (37.4 C)   Recent Labs Lab 06/14/16 1956 06/15/16 0208 06/15/16 1640 06/16/16 0122  WBC 24.8* 26.7*  --  23.0*  CREATININE 2.48* 1.93* 0.93 0.73  LATICACIDVEN 0.8  --   --   --     Estimated Creatinine Clearance: 85.9 mL/min (by C-G formula based on SCr of 0.73 mg/dL).    No Known Allergies  Antimicrobials this admission: Vanc 6/5 >> 6/7 [name] 6/5>> Levaquin 6/5>>  Microbiology results: 5/31 Respiratory cx: Carbapenemase resistant pseudomonas - I to fortaz, S to amikacin                                   Corynebacterium: no sensitivities                                   Kleb peumo: Pan sensitive with I sensitivities to ampicillin Tracheal aspirate 6/5 >> rare GNR, rare GPC, >>  Blood culture 6/5 >> 1 of 2 coag-negative staph, likely MRSE based on PCR panel Urine 6/5 >> multiple species, suggests recollection  Thank you for allowing pharmacy to be a part of this patient's care.  Melburn Popper, PharmD Clinical Pharmacy Resident Pager: [phone] 06/16/16 12:37 PM

## 2016-06-17 LAB — RENAL FUNCTION PANEL
Albumin: 1.6 g/dL — ABNORMAL LOW (ref 3.5–5.0)
Anion gap: 9 (ref 5–15)
BUN: 63 mg/dL — AB (ref 6–20)
CALCIUM: 7.5 mg/dL — AB (ref 8.9–10.3)
CHLORIDE: 105 mmol/L (ref 101–111)
CO2: 28 mmol/L (ref 22–32)
CREATININE: 0.98 mg/dL (ref 0.61–1.24)
GFR calc non Af Amer: >60 mL/min (ref >60)
GLUCOSE: 252 mg/dL — AB (ref 65–99)
Phosphorus: 2 mg/dL — ABNORMAL LOW (ref 2.5–4.6)
Potassium: 3.9 mmol/L (ref 3.5–5.1)
SODIUM: 142 mmol/L (ref 135–145)

## 2016-06-17 LAB — CBC
HCT: 23.3 % — ABNORMAL LOW (ref 39.0–52.0)
HEMOGLOBIN: 7.2 g/dL — AB (ref 13.0–17.0)
MCH: 28.8 pg (ref 26.0–34.0)
MCHC: 30.9 g/dL (ref 30.0–36.0)
MCV: 93.2 fL (ref 78.0–100.0)
Platelets: 335 10*3/uL (ref 150–400)
RBC: 2.5 MIL/uL — AB (ref 4.22–5.81)
RDW: 17 % — ABNORMAL HIGH (ref 11.5–15.5)
WBC: 22.6 10*3/uL — AB (ref 4.0–10.5)

## 2016-06-17 LAB — CULTURE, RESPIRATORY W GRAM STAIN: Culture: NORMAL

## 2016-06-17 LAB — GLUCOSE, CAPILLARY
GLUCOSE-CAPILLARY: 227 mg/dL — AB (ref 65–99)
GLUCOSE-CAPILLARY: 208 mg/dL — AB (ref 65–99)
Glucose-Capillary: 205 mg/dL — ABNORMAL HIGH (ref 65–99)
Glucose-Capillary: 217 mg/dL — ABNORMAL HIGH (ref 65–99)
Glucose-Capillary: 201 mg/dL — ABNORMAL HIGH (ref 65–99)

## 2016-06-17 LAB — CULTURE, RESPIRATORY

## 2016-06-17 LAB — MAGNESIUM: Magnesium: 1.6 mg/dL — ABNORMAL LOW (ref 1.7–2.4)

## 2016-06-17 LAB — PHOSPHORUS: Phosphorus: 2 mg/dL — ABNORMAL LOW (ref 2.5–4.6)

## 2016-06-17 MED ORDER — METOPROLOL TARTRATE 5 MG/5ML IV SOLN
INTRAVENOUS | Status: AC
  Administered 2016-06-17: 5 mg via INTRAVENOUS
  Filled 2016-06-17: qty 5

## 2016-06-17 MED ORDER — METOPROLOL TARTRATE 5 MG/5ML IV SOLN
2.5000 mg | INTRAVENOUS | Status: DC | PRN
  Administered 2016-06-17 (×3): 5 mg via INTRAVENOUS
  Filled 2016-06-17 (×2): qty 5

## 2016-06-17 MED ORDER — METOPROLOL TARTRATE 100 MG PO TABS
100.0000 mg | ORAL_TABLET | Freq: Two times a day (BID) | ORAL | Status: DC
  Administered 2016-06-17 – 2016-06-21 (×8): 100 mg
  Filled 2016-06-17 (×8): qty 1

## 2016-06-17 MED ORDER — DEXTROSE 5 % IV SOLN
2.0000 g | Freq: Two times a day (BID) | INTRAVENOUS | Status: DC
  Administered 2016-06-17 – 2016-06-21 (×9): 2 g via INTRAVENOUS
  Filled 2016-06-17 (×10): qty 2

## 2016-06-17 MED ORDER — SODIUM CHLORIDE 0.45 % IV SOLN
INTRAVENOUS | Status: DC
  Administered 2016-06-17 – 2016-06-18 (×3): via INTRAVENOUS
  Filled 2016-06-17 (×5): qty 1000

## 2016-06-17 NOTE — Progress Notes (Signed)
Family requesting patient not to return to [facility].

## 2016-06-17 NOTE — Progress Notes (Signed)
PULMONARY / CRITICAL CARE MEDICINE   Name: Bryan Frank MRN: [id number] DOB: 08/06/1945    ADMISSION DATE:  06/14/2016 CONSULTATION DATE:  6/5  REFERRING MD:  [facility] MD  CHIEF COMPLAINT:  Renal failure  HISTORY OF PRESENT ILLNESS:   71 year old male with PMH as below, which is significant for DM2, HLD, GERD, HTN, and tobacco abuse. It appears as though he was admitted to [place], [place] hospital 04/2016 for severe COPD exacerbation and dense pneumonia. Also concern for UTI and treated with broad spectrum antibiotics. He improved and was transferred to [facility] [place] 4/19. At that time he was on supplemental O2 during the day and BiPAP at night. On 4/21 his condition worsened requiring intubation. Likely due to severe COPD and possible PNA. He was unable to wean and had tracheostomy placed 4/27. He was found to have bilateral pleural effusions and is s/p pleurex catheter and thoracentesis. Cytology so far negative. He also required HD for worsening renal failure. This continued to worsen. Course also complicated by copious respiratory secretions. 6/5 he was transferred to [facility] at family request for further care and continued hemodialysis.   SUBJECTIVE / interval events:  Good urine output off of CVVH Up to chair this morning Has tolerated trach collar for 48 hours.   VITAL SIGNS: BP (!) 178/150   Pulse 64   Temp 100 F (37.8 C) (Oral)   Resp (!) 25   Ht 5\' 9"  (1.753 m)   Wt 74.9 kg (165 lb 2 oz)   SpO2 97%   BMI 24.38 kg/m   HEMODYNAMICS:    VENTILATOR SETTINGS: Vent Mode: PRVC FiO2 (%):  [28 %-40 %] 35 % Set Rate:  [14 bmp] 14 bmp Vt Set:  [500 mL] 500 mL PEEP:  [5 cmH20] 5 cmH20 Plateau Pressure:  [16 cmH20-18 cmH20] 16 cmH20  INTAKE / OUTPUT: I/O last 3 completed shifts: In: 2825 [I.V.:340; Blood:340; Other:595; NG/GT:1250; IV Piggyback:300] Out: 0938 [Urine:3710; Other:1562]  PHYSICAL EXAMINATION: General:  Ill-appearing elderly man, up to  chair with restraints on Neuro:  Eyes open, tracks, nodded questions, followed some commands, globally weak HEENT:  No oral lesions, pupils equal, tracheostomy in place and looks clean and dry intact Cardiovascular:  Irregular, tachycardic to 120s, no murmur Lungs: Coarse bilaterally, distant Abdomen: Soft, benign, PEG in place Musculoskeletal:  No deformity Skin: Sacral decubitus ulcer dressed  LABS:  BMET  Recent Labs Lab 06/16/16 0122 06/16/16 1840 06/17/16 0629  NA 138 140 142  K 3.7 4.1 3.9  CL 103 104 105  CO2 27 28 28   BUN 40* 48* 63*  CREATININE 0.73 0.83 0.98  GLUCOSE 181* 220* 252*    Electrolytes  Recent Labs Lab 06/16/16 0122 06/16/16 1840 06/17/16 0629  CALCIUM 7.8* 7.6* 7.5*  MG 1.9 1.7 1.6*  PHOS 2.1*  2.1* 1.9* 2.0*  2.0*    CBC  Recent Labs Lab 06/16/16 0122 06/16/16 1840 06/17/16 0629  WBC 23.0* 24.6* 22.6*  HGB 8.5* 7.6* 7.2*  HCT 24.9* 23.8* 23.3*  PLT 303 320 335    Coag's  Recent Labs Lab 06/14/16 1956 06/15/16 0208  APTT  --  39*  INR 1.22  --     Sepsis Markers  Recent Labs Lab 06/14/16 1956 06/14/16 2150 06/15/16 0208 06/16/16 0122  LATICACIDVEN 0.8  --   --   --   PROCALCITON  --  1.59 0.93 0.71    ABG  Recent Labs Lab 06/14/16 2006 06/15/16 1545 06/16/16  1048  PHART 7.424 7.425 7.424  PCO2ART 40.2 41.7 44.2  PO2ART 74.0* 66.6* 62.1*    Liver Enzymes  Recent Labs Lab 06/14/16 1956  06/16/16 0122 06/16/16 1840 06/17/16 0629  AST 101*  --   --   --   --   ALT 88*  --   --   --   --   ALKPHOS 302*  --   --   --   --   BILITOT 0.9  --   --   --   --   ALBUMIN 1.5*  < > 1.6* 1.6* 1.6*  < > = values in this interval not displayed.  Cardiac Enzymes No results for input(s): TROPONINI, PROBNP in the last 168 hours.  Glucose  Recent Labs Lab 06/16/16 1131 06/16/16 1537 06/16/16 1916 06/16/16 2326 06/17/16 0318 06/17/16 0741  GLUCAP 201* 235* 212* 214* 205* 227*    Imaging No results  found.   STUDIES:  Ct head 6/5 >>> chronic microvascular ischemic changes without acute finding  CULTURES: resp cx 5/31 >> Klebsiella (pansensitive), pseudomonas (pan resistant, intermediate to ceftazidime, sensitive to aminoglycoside); Corynebacterium  Respiratory aspirate 6/3>> Klebsiella (pansensitive), pseudomonas (pan resistant, intermediate to ceftazidime, sensitive to aminoglycoside); Corynebacterium  Tracheal aspirate 6/5 >> rare GNR, rare GPC, >>  Blood culture 6/5 >> 1 of 2 coag-negative staph, likely MRSE based on PCR panel Urine 6/5 >> multiple species, suggests recollection  ANTIBIOTICS: Cefepime 5/29 >6/5 Ceftaz 6/5 > Levaquin 6/5 > Vancomycin 6/5 > 6/7  SIGNIFICANT EVENTS: Admit to [facility] 4/19 [facility] 6[facility]  LINES/TUBES: Trach 4/27 > PEG 4/27 > Tunneled RIJ HD cath 6/5 >  DISCUSSION:   ASSESSMENT / PLAN:  PULMONARY A: Acute on chronic hypoxemic and hypercarbic respiratory failure. Severe COPD with +/- exacerbation Pulmonary edema  HCAP - cultures pseudomonas and corynebacterium (no sens done) Bilateral pleural effusions  P:   Currently tolerating trach collar for 48 hours. Question whether he may be able to remain vent free ad lib. Mechanical ventilation available but will not start unless he clinically changes Follow chest x-ray intermittently VAP prevention order set Continue scheduled DuoNeb, scheduled Pulmicort nebs   CARDIOVASCULAR A:  CHF, unspecified. Echocardiogram 6/6> LV EF 55-60%, mildly dilated LA, RA. Diastolic dysfunction. Normal RV size and function Hyperlipidemia Atrial fibrillation with intermittent tachycardia  P:  Started metoprolol 50 mg per tube twice a day on 6/7, increased to 100 mg twice a day on 6/8 Digoxin on hold Continue telemetry in hemodynamically monitoring Hold Catapres and isosorbide for now Hold off on anticoagulation or aspirin until hematuria resolves    RENAL A:   Recurrent (required HD 4/21 -  06/01/16 at [facility]) acute renal failure, serologies negative for glomerulonephritis, likely ATN Neurogenic bladder Hyponatremia  P:   Urine output good, has tolerated discontinuation of CVVH on 6/7. Remain hopeful that he will have renal recovery Appreciate nephrology assistance. Note that we need to take caution that his serum creatinine will not necessarily be a good reflection of his GFR given his low muscle mass No indication to restart hemodialysis at this time   GASTROINTESTINAL A:   GERD  P:   Protonix as ordered Tube feeding SLP evaluation for possible swallowing and PMV  HEMATOLOGIC A:   ? Malignant lesion of sphenoid skull, not seen on CT head Acute blood loss anemia in the setting of hematuria P:  CT scan of head reassuring without any evidence of lesion on the skull base, calvarium Follow CBC, coags intermittently  GENITOURINARY:  A:  Hematuria, suspect traumatic after Foley change  P: Continues to maintain urine output Follow CBC, goal hemoglobin greater than 7 Have not needed to involve urology or initiate continuous bladder irrigation so far. Goal is to avoid   INFECTIOUS A:   HCAP - cultures pseudomonas, klebsiella, and corynebacterium (no sens done). Had been worsening at [facility] despite treatment with cefepime Likely UTI, first culture multiple species  1 of 2 blood cultures MRSE, likely contaminant  Sacral decub PEG site erythema   P:   Continue broad-spectrum antibiotics to cover gram negatives: Ceftaz, Levaquin Vancomycin stopped on 6/7, suspect that coag-negative staph is a contaminant Note pseudomonas is multidrug resistant. Consider initiation of tobramycin nebs but he appears to be improving without this.  Follow culture data Appreciate wound care recommendations  ENDOCRINE A:   DM  P:   Insulin per sliding scale  NEUROLOGIC A:  Acute encephalopathy, etiology unclear but suspect multifactorial: Medications, sepsis, hypercapnia,  progressive renal dysfunction  P:   RASS goal: 0 Improved mental status with correction of metabolic status, infection Follow clinically   FAMILY  - Updates: No family present  - Inter-disciplinary family meet or Palliative Care meeting due by:  6/12  Should be able to transfer to a stepdown bed today now that he's off the Ventilation and hemodialysis. We will ask [facility] to assume his care on 6/9, follow-up again on 6/11  Independent CC time 33 minutes  Baltazar Apo, MD, PhD 06/17/2016, 10:15 AM [facility] [phone] or if no answer [phone]

## 2016-06-17 NOTE — Progress Notes (Signed)
[facility] Rounding Note  Interval History:  Transferred from [place] 6/5. PMH DM, HTN, HLD, GERD. Origin [place] c/COPD/PNA/UTI. Baseline creatinine April 2018 0.5. To [facility] 04/28/16. Eventual trach 2/2 unable to wean. G-tube. Bilateral effusions requiring Pleurx tube/thoracentesis. AKI preceded [facility] transfer. Exhaustive serologic evaluation neg for GN. HD dependent (started at [facility]) 04/30/16-06/01/16, recovered function (creatinine down to 1.3 on 5/24)/TDC removed, then recurrent AKI in setting of HCAP(pseudomonas and corynepbacterium)/bilateral effusions. BUN/creatinine up to 160/2.48. TDC replaced, pt transferred to [facility] for further care/HD. Family meeting at [place] 6/4 - wanted all done. CRRT started on arrival to [facility] 06/14/16.  Today:  CRRT here PM of 6/5->AM of 6/7 Excellent UOP 2.45 liters Creatinine OFF CRRT has not bumped back up much yet but VERY low muscle mass and not accurate predictor of eGFR   Objective Vital signs in last 24 hours: Vitals:   06/17/16 0434 06/17/16 0449 06/17/16 0500 06/17/16 0741  BP:  127/64 (!) 135/52   Pulse:   (!) 112   Resp:   (!) 24   Temp:    100 F (37.8 C)  TempSrc:    Oral  SpO2:   97%   Weight: 74.9 kg (165 lb 2 oz)     Height:       Weight change: -1 kg (-2 lb 3.3 oz)  Intake/Output Summary (Last 24 hours) at 06/17/16 0741 Last data filed at 06/17/16 0600  Gross per 24 hour  Intake             1275 ml  Output             2540 ml  Net            -1265 ml   Physical Exam:  Blood pressure (!) 135/52, pulse (!) 112, temperature 100 F (37.8 C), temperature source Oral, resp. rate (!) 24, height '5\' 9"'  (1.753 m), weight 74.9 kg (165 lb 2 oz), SpO2 97 %.   Opens eyes, not following commands Back on trach collar (ven last PM) Hands mittened TDC/GTube/trach/foley (still gross hematuria since foley switched out - purulent urine prior) Overall decreased muscle mass Ant coarse BS but clear Abd with GTube mild redness at site No edema  whatsoever Grossly bloody urine in foley some clearing sinceyesterday TDC (6/5 placed at [facility])   Recent Labs Lab 06/14/16 1956 06/15/16 0208 06/15/16 1640 06/16/16 0122 06/16/16 1840 06/17/16 0629  NA 133* 133* 137 138 140 142  K 4.4 4.0 3.3* 3.7 4.1 3.9  CL 94* 95* 101 103 104 105  CO2 '25 26 26 27 28 28  ' GLUCOSE 229* 247* 119* 181* 220* 252*  BUN 160* 123* 53* 40* 48* 63*  CREATININE 2.48* 1.93* 0.93 0.73 0.83 0.98  CALCIUM 7.4* 7.6* 7.6* 7.8* 7.6* 7.5*  PHOS 5.6* 4.7* 2.4* 2.1*  2.1* 1.9* 2.0*  2.0*    Recent Labs Lab 06/14/16 1956  06/16/16 0122 06/16/16 1840 06/17/16 0629  AST 101*  --   --   --   --   ALT 88*  --   --   --   --   ALKPHOS 302*  --   --   --   --   BILITOT 0.9  --   --   --   --   PROT 6.2*  --   --   --   --   ALBUMIN 1.5*  < > 1.6* 1.6* 1.6*  < > = values in this interval not displayed.  Recent Labs Lab  06/14/16 1956 06/15/16 0208 06/15/16 1640 06/16/16 0122 06/16/16 1840  WBC 24.8* 26.7*  --  23.0* 24.6*  NEUTROABS 22.7*  --   --   --   --   HGB 7.3* 7.5* 6.4* 8.5* 7.6*  HCT 21.4* 23.6* 18.5* 24.9* 23.8*  MCV 92.2 91.5  --  96.1 91.5  PLT 274 275  --  303 320    Recent Labs Lab 06/16/16 1131 06/16/16 1537 06/16/16 1916 06/16/16 2326 06/17/16 0318  GLUCAP 201* 235* 212* 214* 205*   ABG    Component Value Date/Time   PHART 7.424 06/16/2016 1048   PCO2ART 44.2 06/16/2016 1048   PO2ART 62.1 (L) 06/16/2016 1048   HCO3 28.5 (H) 06/16/2016 1048   TCO2 28 06/14/2016 2006   O2SAT 92.3 06/16/2016 1048   Results for CALOGERO, GEISEN (MRN [id number]) as of 06/15/2016 08:01  Ref. Range 06/14/2016 23:18  Appearance Latest Ref Range: CLEAR  CLOUDY (A)  Bacteria, UA Latest Ref Range: NONE SEEN  MANY (A)  Bilirubin Urine Latest Ref Range: NEGATIVE  NEGATIVE  Color, Urine Latest Ref Range: YELLOW  RED (A)  Glucose Latest Ref Range: NEGATIVE mg/dL NEGATIVE  Hgb urine dipstick Latest Ref Range: NEGATIVE  LARGE (A)  Ketones, ur Latest Ref  Range: NEGATIVE mg/dL NEGATIVE  Leukocytes, UA Latest Ref Range: NEGATIVE  LARGE (A)  Nitrite Latest Ref Range: NEGATIVE  NEGATIVE  pH Latest Ref Range: 5.0 - 8.0  8.0  Protein Latest Ref Range: NEGATIVE mg/dL 100 (A)  RBC / HPF Latest Ref Range: 0 - 5 RBC/hpf TOO NUMEROUS TO C...  Specific Gravity, Urine Latest Ref Range: 1.005 - 1.030  1.011  Squamous Epithelial / LPF Latest Ref Range: NONE SEEN  NONE SEEN  WBC, UA Latest Ref Range: 0 - 5 WBC/hpf TOO NUMEROUS TO C...   6/5 Blood cultures MRSA (1), staph coag neg (1) GPC (1) 6/5 Urine culture - multiple spp  6/5 Tracheal aspirate - normal flora  Studies/Results: US Renal  Result Date: 06/16/2016 CLINICAL DATA:  Hematuria. EXAM: RENAL / URINARY TRACT ULTRASOUND COMPLETE COMPARISON:  No prior. FINDINGS: Right Kidney: Length: 14.9 cm. Echogenicity within normal limits. Prominent column of Bertin. If symptoms persist CT can be performed . No mass or hydronephrosis visualized. Left Kidney: Length: 14.7 cm. Echogenicity within normal limits. No mass or hydronephrosis visualized. Bladder: Foley catheter bladder.  Bladder decompressed. IMPRESSION: No acute or focal abnormality identified. No hydronephrosis. Bladder is decompressed by a Foley catheter. Electronically Signed   By: Marcello Moores  Register   On: 06/16/2016 06:16   Medications: . sodium chloride 250 mL (06/14/16 2221)  . cefTAZidime (FORTAZ)  IV Stopped (06/16/16 2228)  . feeding supplement (VITAL AF 1.2 CAL) 1,000 mL (06/16/16 1502)  . levofloxacin (LEVAQUIN) IV     . budesonide (PULMICORT) nebulizer solution  0.5 mg Nebulization BID  . chlorhexidine gluconate (MEDLINE KIT)  15 mL Mouth Rinse BID  . Chlorhexidine Gluconate Cloth  6 each Topical Daily  . collagenase   Topical Daily  . darbepoetin (ARANESP) injection - NON-DIALYSIS  200 mcg Subcutaneous Q Wed-1800  . feeding supplement (PRO-STAT SUGAR FREE 64)  30 mL Per Tube 5 X Daily  . insulin aspart  0-15 Units Subcutaneous Q4H  .  ipratropium-albuterol  3 mL Nebulization Q6H  . mouth rinse  15 mL Mouth Rinse QID  . metoprolol tartrate  50 mg Per Tube BID  . midazolam  2 mg Intravenous Once  . pantoprazole (PROTONIX) IV  40 mg  Intravenous QHS   Background:  PMH DM, HTN, HLD, GERD. Origin [place] c/COPD/PNA/UTI. Baseline creatinine April 2018 0.5. To [facility] 04/28/16. Eventual trach 2/2 unable to wean. G-tube. Bilateral effusions requiring Pleurx tube/thoracentesis. AKI preceded [facility] transfer. Exhaustive serologic evaluation neg for GN after arrival to [facility] (short of renal bx). HD dependent 04/30/16-06/01/16, creatinine down to 1.3 on 5/24, TDC removed Then recurrent AKI in setting of HCAP(pseudomonas and corynebacterium)/bilateral effusions/poss UTI/presumed sepsis. BUN/creatinine up to 160/2.48. TDC replaced, pt transferred to [facility] for further care/HD. Family meeting at [place] 6/4 - wanted all done. CRRT started on arrival to [facility] 06/14/16. [place] normal size kidneys no hydro.   1. AKI - BL creatinine 0.5 prior to April 2018. AKI with "exhaustive workup short of renal biopsy" while at [facility]. HD dependent 04/30/16-06/01/16. Creatinine nadir at [facility] 1.3 on 5/24. TDC removed->recurrent AKI, progressive since 5/31, likely sepsis related, transferred 6/5 with BUN/Cr 160/2.48.->TDC replaced R IJ 6/5 1. CRRT  (primarily d/t severe azotemia without volume removal) . 6/5->6/7. Stopped 6/7. Non-oliguric 2. Trend labs - creatinine still <1 post D/C of CRRT 3. With low muscle mass creatinine low misleading not good predictor of eGFR - so be careful with vanco dosing 4. Recommend starting slow IVF (half normal saline for free water and volume as Na trending up) 2. Hyperkalemia - corrected. 3. Gross hematuria - occurred when foley changed out (purulent in foley prior to change). Nothing grossly on [place]. Urine has cleared some 4. VDRF/trach - CCM managing. Back on vent last PM. Sats 88% by RT when switched to RA transiently this AM 5. ID:  HCAP ([facility] cultures + CRE pseudomonas, corynebacterium).  6/5 BC+ MRSA (1) CNS (1) GPC (1) Current ATB's V/F/levaquin. 6. HTN - BB started back for HTN/tachycardia 7. PAF - dig stopped. (not good agent w/fluctuating GFR) 8. Anemia - Aranesp at [facility] (resumed here 200/week).  Large drop in Hb at [facility] prior to transfer (11->8) and further declined to 6.4 6/6 requiring transfusion 1 unit. Gross hematuria, but no other obvious sources blood loss 9. Contact isolation - CRE pseudomonas at [facility]; [facility] - MRSA PCR +BC+ 10. Encephalopathy - opens eyes. Not following commands for me   Jamal Maes, MD [place] [phone] pager 06/17/2016, 7:41 AM

## 2016-06-17 NOTE — Progress Notes (Addendum)
Results for Bryan Frank, Bryan Frank (MRN [id number]) as of 06/17/2016 10:15  Ref. Range 06/16/2016 15:37 06/16/2016 19:16 06/16/2016 23:26 06/17/2016 03:18 06/17/2016 07:41  Glucose-Capillary Latest Ref Range: 65 - 99 mg/dL 235 (H) 212 (H) 214 (H) 205 (H) 227 (H)  Noted that blood sugars continue to be greater than 180 mg/dl.  Noted that current orders are for Novolog MODERATE correction scale every 4 hours.  Recommend Novolog 3-4 units tube feed coverage every 4 hours. Harvel Ricks RN BSN CDE Diabetes Coordinator Pager: [phone]  8am-5pm

## 2016-06-17 NOTE — Progress Notes (Signed)
Patient tolerating well on 35% ATC. 02 saturations at 97% and in no distress. Will continue with trach collar, vent on standby at bedside. RT will continue to monitor.

## 2016-06-17 NOTE — Progress Notes (Signed)
Pharmacy Antibiotic Note  Bryan Frank is a 71 y.o. male transferred to [facility] on 06/14/2016 due to acute renal failure.  Pharmacy has been consulted for fortaz, and levaquin dosing. Vancomycin has been discontinued.   Pt transferred from [facility] where he was being treated with cefepime for his HCAP. Pt then transitioned to broad spectrum antibiotics. Pt on CRRT 6/5 > 6/7. With pt coming off of CRRT will adjust dosing of abx accordingly. Pt making great urine. SCr is falsely low due to low muscle mass. Will be conservative with any dose adjustments.  See section below and paper chart for organisms and sensitivities from [facility].  Plan:  Increase ceftazidime to q12 hours Levaquin 500g q48 hours Follow renal function closely LOT plans  Height: 5\' 9"  (175.3 cm) Weight: 165 lb 2 oz (74.9 kg) IBW/kg (Calculated) : 70.7  Temp (24hrs), Avg:99.7 F (37.6 C), Min:98.2 F (36.8 C), Max:101.2 F (38.4 C)   Recent Labs Lab 06/14/16 1956 06/15/16 0208 06/15/16 1640 06/16/16 0122 06/16/16 1840 06/17/16 0629  WBC 24.8* 26.7*  --  23.0* 24.6* 22.6*  CREATININE 2.48* 1.93* 0.93 0.73 0.83 0.98  LATICACIDVEN 0.8  --   --   --   --   --     Estimated Creatinine Clearance: 70.1 mL/min (by C-G formula based on SCr of 0.98 mg/dL).    No Known Allergies  Antimicrobials this admission: Vanc 6/5 >> 6/7 [name] 6/5>> Levaquin 6/5>>  Microbiology results: 5/31 Respiratory cx: Carbapenemase resistant pseudomonas - I to fortaz, S to amikacin                                   Corynebacterium: no sensitivities                                   Kleb peumo: Pan sensitive with I sensitivities to ampicillin Tracheal aspirate 6/5 >> rare GNR, rare GPC, >>  Blood culture 6/5 >> 1 of 2 coag-negative staph, likely MRSE based on PCR panel Urine 6/5 >> multiple species, suggests recollection  Thank you for allowing pharmacy to be a part of this patient's care.  Melburn Popper, PharmD Clinical Pharmacy  Resident Pager: [phone] 06/17/16 1:37 PM

## 2016-06-18 DIAGNOSIS — J189 Pneumonia, unspecified organism: Secondary | ICD-10-CM

## 2016-06-18 DIAGNOSIS — J9690 Respiratory failure, unspecified, unspecified whether with hypoxia or hypercapnia: Secondary | ICD-10-CM

## 2016-06-18 DIAGNOSIS — L8990 Pressure ulcer of unspecified site, unspecified stage: Secondary | ICD-10-CM | POA: Insufficient documentation

## 2016-06-18 LAB — CBC
HEMATOCRIT: 22.7 % — AB (ref 39.0–52.0)
Hemoglobin: 6.9 g/dL — CL (ref 13.0–17.0)
MCH: 28.9 pg (ref 26.0–34.0)
MCHC: 30.4 g/dL (ref 30.0–36.0)
MCV: 95 fL (ref 78.0–100.0)
PLATELETS: 318 10*3/uL (ref 150–400)
RBC: 2.39 MIL/uL — ABNORMAL LOW (ref 4.22–5.81)
RDW: 17.5 % — AB (ref 11.5–15.5)
WBC: 24.1 10*3/uL — AB (ref 4.0–10.5)

## 2016-06-18 LAB — GLUCOSE, CAPILLARY
GLUCOSE-CAPILLARY: 152 mg/dL — AB (ref 65–99)
GLUCOSE-CAPILLARY: 188 mg/dL — AB (ref 65–99)
GLUCOSE-CAPILLARY: 201 mg/dL — AB (ref 65–99)
GLUCOSE-CAPILLARY: 244 mg/dL — AB (ref 65–99)
GLUCOSE-CAPILLARY: 246 mg/dL — AB (ref 65–99)
Glucose-Capillary: 216 mg/dL — ABNORMAL HIGH (ref 65–99)
Glucose-Capillary: 220 mg/dL — ABNORMAL HIGH (ref 65–99)

## 2016-06-18 LAB — RENAL FUNCTION PANEL
ALBUMIN: 1.5 g/dL — AB (ref 3.5–5.0)
ANION GAP: 9 (ref 5–15)
BUN: 74 mg/dL — ABNORMAL HIGH (ref 6–20)
CALCIUM: 7.2 mg/dL — AB (ref 8.9–10.3)
CO2: 29 mmol/L (ref 22–32)
CREATININE: 0.97 mg/dL (ref 0.61–1.24)
Chloride: 107 mmol/L (ref 101–111)
GFR calc Af Amer: >60 mL/min (ref >60)
GFR calc non Af Amer: >60 mL/min (ref >60)
GLUCOSE: 180 mg/dL — AB (ref 65–99)
PHOSPHORUS: 2.6 mg/dL (ref 2.5–4.6)
Potassium: 3.7 mmol/L (ref 3.5–5.1)
SODIUM: 145 mmol/L (ref 135–145)

## 2016-06-18 LAB — PREPARE RBC (CROSSMATCH)

## 2016-06-18 LAB — PHOSPHORUS: Phosphorus: 2.6 mg/dL (ref 2.5–4.6)

## 2016-06-18 LAB — MAGNESIUM: Magnesium: 1.4 mg/dL — ABNORMAL LOW (ref 1.7–2.4)

## 2016-06-18 MED ORDER — SODIUM CHLORIDE 0.9 % IV SOLN
Freq: Once | INTRAVENOUS | Status: AC
  Administered 2016-06-18: 07:00:00 via INTRAVENOUS

## 2016-06-18 MED ORDER — FUROSEMIDE 10 MG/ML IJ SOLN: 20.0000 mg | Freq: Once | INTRAMUSCULAR | Status: DC

## 2016-06-18 MED ORDER — SODIUM CHLORIDE 0.9 % IV SOLN
Freq: Once | INTRAVENOUS | Status: AC
  Administered 2016-06-18: 06:00:00 via INTRAVENOUS

## 2016-06-18 MED ORDER — PRO-STAT SUGAR FREE PO LIQD
30.0000 mL | Freq: Two times a day (BID) | ORAL | Status: DC
  Administered 2016-06-18 – 2016-06-21 (×6): 30 mL
  Filled 2016-06-18 (×6): qty 30

## 2016-06-18 NOTE — Progress Notes (Signed)
SLP Cancellation Note  Patient Details Name: Trigger Frasier MRN: [id number] DOB: 02-11-1945   Cancelled treatment:       Reason Eval/Treat Not Completed: Medical issues which prohibited therapy. Spoke with RN, requests deferral of PMV assessment; pt with copious secretions. Will defer swallow order pending PMV evaluation.  Deneise Lever, [place], [place] Speech-Language Pathologist [phone]   Aliene Altes 06/18/2016, 1:56 PM

## 2016-06-18 NOTE — Progress Notes (Signed)
CRITICAL VALUE ALERT  Critical Value:  Hemoglobin 6.9  Date & Time Notied:  06/18/16 0555  Provider Notified: Hal Hope  Orders Received/Actions taken:

## 2016-06-18 NOTE — Progress Notes (Addendum)
PROGRESS NOTE                                                                                                                                                                                                             Patient Demographics:    Bryan Frank, is a 71 y.o. male, DOB - 01/13/1945, AJO:[id number]  Admit date - 06/14/2016   Admitting Physician Javier Glazier, MD  Outpatient Primary MD for the patient is No primary care provider on file.  LOS - 4   No chief complaint on file.      Brief Narrative   71 year old male with PMH as below, which is significant for DM2, HLD, GERD, HTN, and tobacco abuse. It appears as though he was admitted to [facility] [place] [place] hospital 04/2016 for severe COPD exacerbation and dense pneumonia. Also concern for UTI and treated with broad spectrum antibiotics. He improved and was transferred to [facility] in [place] 4/19. At that time he was on supplemental O2 during the day and BiPAP at night. On 4/21 his condition worsened requiring intubation. Likely due to severe COPD and possible PNA. He was unable to wean and had tracheostomy placed 4/27. He was found to have bilateral pleural effusions and is s/p pleurex catheter and thoracentesis. Cytology so far negative. He also required HD for worsening renal failure. This continued to worsen. Course also complicated by copious respiratory secretions. 6/5 he was transferred to [facility] at family request for further care and continued hemodialysis.    Subjective:    Bryan Frank today Is nonverbal, cannot provide any complaints, no significant events overnight per staff .    Assessment  & Plan :    Active Problems:   ESRD (end stage renal disease) ([place])   Acute on chronic alteration in mental status   PNA (pneumonia)   Tracheostomy care ([place])   Pressure injury of skin  Trach dependent respiratory failure - Off the vent for last 48 hours, currently on  ATC. - In the setting of severe COPD at baseline, worsening secondary to pulmonary edema and HCAP, and bilateral pleural effusion. - Weaning per pulmonary, currently trach dependent.  COPD - No active wheezing, continue with scheduled DuoNeb's, and Pulmicort  HCAP - cultures pseudomonas, klebsiella, and corynebacterium (no sens done). Had been worsening at [facility] despite treatment with  cefepime. - Discussed with the CCM, continue ceftaz and levofloxacin for total of 10 days  AKI - Management per renal, required dialysis at [facility], and CRRT upon presentation to [facility], creatinine remained stable off CRRT, management per renal  Hematuria - Secondary to traumatic Foley, improving.  MRSA bacteremia - 1/2 bottles, most likely contaminant  Hypertension - Blood pressure improved, continue with metoprolol  Paroxysmal A. Fib - Rate controlled on metoprolol,Hold off on anticoagulation or aspirin until hematuria resolves   Acute encephalopathy -  etiology unclear but suspect multifactorial: Medications, sepsis, hypercapnia, progressive renal dysfunction  Anemia -  gross hematuria contributing, will transfuse 2 units PRBC today for hemoglobin of 6.9.  Sacral pressure ulcer - Continue with wound care  Code Status :full  Family Communication  : none at bedside  Disposition Plan  : pending further work up.  Consults  :  PCCM, Renal  Procedures  : CRRT  DVT Prophylaxis  :  - SCDs   Lab Results  Component Value Date   PLT 318 06/18/2016    Antibiotics  :   Anti-infectives    Start     Dose/Rate Route Frequency Ordered Stop   06/17/16 2300  levofloxacin (LEVAQUIN) IVPB 500 mg     500 mg 100 mL/hr over 60 Minutes Intravenous Every 48 hours 06/16/16 0824     06/17/16 1345  cefTAZidime (FORTAZ) 2 g in dextrose 5 % 50 mL IVPB     2 g 100 mL/hr over 30 Minutes Intravenous Every 12 hours 06/17/16 1333     06/16/16 2200  cefTAZidime (FORTAZ) 1 g in dextrose 5 % 50 mL IVPB   Status:  Discontinued     1 g 100 mL/hr over 30 Minutes Intravenous Every 24 hours 06/16/16 0824 06/16/16 1409   06/16/16 2200  cefTAZidime (FORTAZ) 2 g in dextrose 5 % 50 mL IVPB  Status:  Discontinued     2 g 100 mL/hr over 30 Minutes Intravenous Every 24 hours 06/16/16 1409 06/17/16 1333   06/15/16 2200  Levofloxacin (LEVAQUIN) IVPB 250 mg  Status:  Discontinued     250 mg 50 mL/hr over 60 Minutes Intravenous Every 24 hours 06/14/16 2108 06/16/16 0824   06/14/16 2300  vancomycin (VANCOCIN) 1,250 mg in sodium chloride 0.9 % 250 mL IVPB     1,250 mg 166.7 mL/hr over 90 Minutes Intravenous  Once 06/14/16 2108 06/15/16 0030   06/14/16 2300  vancomycin (VANCOCIN) IVPB 750 mg/150 ml premix  Status:  Discontinued     750 mg 150 mL/hr over 60 Minutes Intravenous Every 24 hours 06/14/16 2108 06/16/16 0824   06/14/16 2200  levofloxacin (LEVAQUIN) IVPB 500 mg     500 mg 100 mL/hr over 60 Minutes Intravenous  Once 06/14/16 2108 06/14/16 2330   06/14/16 2200  cefTAZidime (FORTAZ) 2 g in dextrose 5 % 50 mL IVPB  Status:  Discontinued     2 g 100 mL/hr over 30 Minutes Intravenous Every 12 hours 06/14/16 2108 06/16/16 0824        Objective:   Vitals:   06/18/16 0747 06/18/16 0748 06/18/16 0750 06/18/16 0934  BP:      Pulse:   95 97  Resp:   (!) 25 (!) 33  Temp:    99.4 F (37.4 C)  TempSrc:    Oral  SpO2: 100% 100% 100% 94%  Weight:      Height:        Wt Readings from Last 3 Encounters:  06/18/16  102.4 kg (225 lb 12 oz)     Intake/Output Summary (Last 24 hours) at 06/18/16 1043 Last data filed at 06/18/16 0934  Gross per 24 hour  Intake             2630 ml  Output             2550 ml  Net               80 ml     Physical Exam  Awake , Noncommunicative, does not follow commands Trach collar, no secretions,  Chest with good air entry bilaterally, no wheezing, has TDC and chest Regular rate and rhythm, no Rubs,  murmur or gallops Abdomen soft, nontender, nondistended,  bowel sounds present, PEG present with slight erythema about the insertion site Extremities with no edema, clubbing or cyanosis, significant muscle wasting    Data Review:    CBC  Recent Labs Lab 06/14/16 1956 06/15/16 0208 06/15/16 1640 06/16/16 0122 06/16/16 1840 06/17/16 0629 06/18/16 0438  WBC 24.8* 26.7*  --  23.0* 24.6* 22.6* 24.1*  HGB 7.3* 7.5* 6.4* 8.5* 7.6* 7.2* 6.9*  HCT 21.4* 23.6* 18.5* 24.9* 23.8* 23.3* 22.7*  PLT 274 275  --  303 320 335 318  MCV 92.2 91.5  --  96.1 91.5 93.2 95.0  MCH 31.5 29.1  --  32.8 29.2 28.8 28.9  MCHC 34.1 31.8  --  34.1 31.9 30.9 30.4  RDW 17.1* 17.2*  --  16.6* 16.9* 17.0* 17.5*  LYMPHSABS 1.2  --   --   --   --   --   --   MONOABS 0.7  --   --   --   --   --   --   EOSABS 0.0  --   --   --   --   --   --   BASOSABS 0.2*  --   --   --   --   --   --     Chemistries   Recent Labs Lab 06/14/16 1956 06/15/16 0208 06/15/16 1640 06/16/16 0122 06/16/16 1840 06/17/16 0629 06/18/16 0438  NA 133* 133* 137 138 140 142 145  K 4.4 4.0 3.3* 3.7 4.1 3.9 3.7  CL 94* 95* 101 103 104 105 107  CO2 _0 GLUCOSE 229* 247* 119* 181* 220* 252* 180*  BUN 160* 123* 53* 40* 48* 63* 74*  CREATININE 2.48* 1.93* 0.93 0.73 0.83 0.98 0.97  CALCIUM 7.4* 7.6* 7.6* 7.8* 7.6* 7.5* 7.2*  MG 2.0 2.0  --  1.9 1.7 1.6* 1.4*  AST 101*  --   --   --   --   --   --   ALT 88*  --   --   --   --   --   --   ALKPHOS 302*  --   --   --   --   --   --   BILITOT 0.9  --   --   --   --   --   --    ------------------------------------------------------------------------------------------------------------------ No results for input(s): CHOL, HDL, LDLCALC, TRIG, CHOLHDL, LDLDIRECT in the last 72 hours.  No results found for: HGBA1C ------------------------------------------------------------------------------------------------------------------ No results for input(s): TSH, T4TOTAL, T3FREE, THYROIDAB in the last 72 hours.  Invalid input(s):  FREET3 ------------------------------------------------------------------------------------------------------------------ No results for input(s): VITAMINB12, FOLATE, FERRITIN, TIBC, IRON, RETICCTPCT in the last 72 hours.  Coagulation profile  Recent Labs Lab 06/14/16 1956  INR 1.22    No results for input(s): DDIMER in the last 72 hours.  Cardiac Enzymes No results for input(s): CKMB, TROPONINI, MYOGLOBIN in the last 168 hours.  Invalid input(s): CK ------------------------------------------------------------------------------------------------------------------ No results found for: BNP  Inpatient Medications  Scheduled Meds: . budesonide (PULMICORT) nebulizer solution  0.5 mg Nebulization BID  . chlorhexidine gluconate (MEDLINE KIT)  15 mL Mouth Rinse BID  . Chlorhexidine Gluconate Cloth  6 each Topical Daily  . collagenase   Topical Daily  . darbepoetin (ARANESP) injection - NON-DIALYSIS  200 mcg Subcutaneous Q Wed-1800  . feeding supplement (PRO-STAT SUGAR FREE 64)  30 mL Per Tube BID  . insulin aspart  0-15 Units Subcutaneous Q4H  . ipratropium-albuterol  3 mL Nebulization Q6H  . mouth rinse  15 mL Mouth Rinse QID  . metoprolol tartrate  100 mg Per Tube BID  . midazolam  2 mg Intravenous Once  . pantoprazole (PROTONIX) IV  40 mg Intravenous QHS   Continuous Infusions: . sodium chloride Stopped (06/18/16 0100)  . cefTAZidime (FORTAZ)  IV 2 g (06/18/16 1016)  . feeding supplement (VITAL AF 1.2 CAL) 1,000 mL (06/18/16 0652)  . levofloxacin (LEVAQUIN) IV Stopped (06/18/16 0050)  . sodium chloride 0.45 % 1,000 mL infusion 100 mL/hr at 06/18/16 1011   PRN Meds:.sodium chloride, metoprolol tartrate  Micro Results Recent Results (from the past 240 hour(s))  MRSA PCR Screening     Status: None   Collection Time: 06/14/16  7:00 PM  Result Value Ref Range Status   MRSA by PCR NEGATIVE NEGATIVE Final    Comment:        The GeneXpert MRSA Assay (FDA approved for NASAL  specimens only), is one component of a comprehensive MRSA colonization surveillance program. It is not intended to diagnose MRSA infection nor to guide or monitor treatment for MRSA infections.   Culture, Urine     Status: Abnormal   Collection Time: 06/14/16  7:34 PM  Result Value Ref Range Status   Specimen Description URINE, RANDOM  Final   Special Requests NONE  Final   Culture MULTIPLE SPECIES PRESENT, SUGGEST RECOLLECTION (A)  Final   Report Status 06/16/2016 FINAL  Final  Culture, respiratory (NON-Expectorated)     Status: None   Collection Time: 06/14/16  7:34 PM  Result Value Ref Range Status   Specimen Description TRACHEAL ASPIRATE  Final   Special Requests NONE  Final   Gram Stain   Final    MODERATE WBC PRESENT, PREDOMINANTLY PMN FEW SQUAMOUS EPITHELIAL CELLS PRESENT FEW GRAM POSITIVE RODS RARE GRAM NEGATIVE RODS RARE GRAM POSITIVE COCCI IN PAIRS    Culture Consistent with normal respiratory flora.  Final   Report Status 06/16/2016 FINAL  Final  Culture, blood (Routine X 2) w Reflex to ID Panel     Status: Abnormal   Collection Time: 06/14/16  7:40 PM  Result Value Ref Range Status   Specimen Description BLOOD RIGHT ANTECUBITAL  Final   Special Requests IN PEDIATRIC BOTTLE Blood Culture adequate volume  Final   Culture  Setup Time   Final    GRAM POSITIVE COCCI IN PEDIATRIC BOTTLE CRITICAL RESULT CALLED TO, READ BACK BY AND VERIFIED WITH: T DANG PHARMD 1920 06/15/16 A BROWNING    Culture (A)  Final    STAPHYLOCOCCUS SPECIES (COAGULASE NEGATIVE) THE SIGNIFICANCE OF ISOLATING THIS ORGANISM FROM A SINGLE SET OF BLOOD CULTURES WHEN MULTIPLE SETS ARE DRAWN IS UNCERTAIN. PLEASE NOTIFY THE MICROBIOLOGY DEPARTMENT WITHIN ONE WEEK IF  SPECIATION AND SENSITIVITIES ARE REQUIRED.    Report Status 06/16/2016 FINAL  Final  Blood Culture ID Panel (Reflexed)     Status: Abnormal   Collection Time: 06/14/16  7:40 PM  Result Value Ref Range Status   Enterococcus species NOT  DETECTED NOT DETECTED Final   Listeria monocytogenes NOT DETECTED NOT DETECTED Final   Staphylococcus species DETECTED (A) NOT DETECTED Final    Comment: Methicillin (oxacillin) resistant coagulase negative staphylococcus. Possible blood culture contaminant (unless isolated from more than one blood culture draw or clinical case suggests pathogenicity). No antibiotic treatment is indicated for blood  culture contaminants. CRITICAL RESULT CALLED TO, READ BACK BY AND VERIFIED WITH: T DANG PHARMD 1920 06/15/16 A BROWNING    Staphylococcus aureus NOT DETECTED NOT DETECTED Final   Methicillin resistance DETECTED (A) NOT DETECTED Final    Comment: CRITICAL RESULT CALLED TO, READ BACK BY AND VERIFIED WITH: T DANG PHARMD 1920 06/15/16 A BROWNING    Streptococcus species NOT DETECTED NOT DETECTED Final   Streptococcus agalactiae NOT DETECTED NOT DETECTED Final   Streptococcus pneumoniae NOT DETECTED NOT DETECTED Final   Streptococcus pyogenes NOT DETECTED NOT DETECTED Final   Acinetobacter baumannii NOT DETECTED NOT DETECTED Final   Enterobacteriaceae species NOT DETECTED NOT DETECTED Final   Enterobacter cloacae complex NOT DETECTED NOT DETECTED Final   Escherichia coli NOT DETECTED NOT DETECTED Final   Klebsiella oxytoca NOT DETECTED NOT DETECTED Final   Klebsiella pneumoniae NOT DETECTED NOT DETECTED Final   Proteus species NOT DETECTED NOT DETECTED Final   Serratia marcescens NOT DETECTED NOT DETECTED Final   Haemophilus influenzae NOT DETECTED NOT DETECTED Final   Neisseria meningitidis NOT DETECTED NOT DETECTED Final   Pseudomonas aeruginosa NOT DETECTED NOT DETECTED Final   Candida albicans NOT DETECTED NOT DETECTED Final   Candida glabrata NOT DETECTED NOT DETECTED Final   Candida krusei NOT DETECTED NOT DETECTED Final   Candida parapsilosis NOT DETECTED NOT DETECTED Final   Candida tropicalis NOT DETECTED NOT DETECTED Final  Culture, blood (Routine X 2) w Reflex to ID Panel     Status:  None (Preliminary result)   Collection Time: 06/14/16  7:50 PM  Result Value Ref Range Status   Specimen Description BLOOD RIGHT HAND  Final   Special Requests IN PEDIATRIC BOTTLE Blood Culture adequate volume  Final   Culture NO GROWTH 3 DAYS  Final   Report Status PENDING  Incomplete  Culture, respiratory (NON-Expectorated)     Status: None   Collection Time: 06/16/16 10:44 AM  Result Value Ref Range Status   Specimen Description TRACHEAL ASPIRATE  Final   Special Requests NONE  Final   Gram Stain   Final    RARE WBC PRESENT, PREDOMINANTLY MONONUCLEAR NO ORGANISMS SEEN    Culture Consistent with normal respiratory flora.  Final   Report Status 06/17/2016 FINAL  Final    Radiology Reports Ct Head Wo Contrast  Result Date: 06/14/2016 CLINICAL DATA:  Altered mental status EXAM: CT HEAD WITHOUT CONTRAST TECHNIQUE: Contiguous axial images were obtained from the base of the skull through the vertex without intravenous contrast. COMPARISON:  None. FINDINGS: Brain: No mass lesion, intraparenchymal hemorrhage or extra-axial collection. No evidence of acute cortical infarct. There is periventricular hypoattenuation compatible with chronic microvascular disease. There is diffuse atrophy without lobar predominance. Vascular: Atherosclerotic calcification of the vertebral and internal carotid arteries at the skull base. Skull: Normal visualized skull base, calvarium and extracranial soft tissues. Sinuses/Orbits: No sinus fluid levels or advanced  mucosal thickening. No mastoid effusion. Normal orbits. IMPRESSION: Chronic microvascular ischemia without acute intracranial abnormality. Electronically Signed   By: Ulyses Jarred M.D.   On: 06/14/2016 21:20   US Renal  Result Date: 06/16/2016 CLINICAL DATA:  Hematuria. EXAM: RENAL / URINARY TRACT ULTRASOUND COMPLETE COMPARISON:  No prior. FINDINGS: Right Kidney: Length: 14.9 cm. Echogenicity within normal limits. Prominent column of Bertin. If symptoms  persist CT can be performed . No mass or hydronephrosis visualized. Left Kidney: Length: 14.7 cm. Echogenicity within normal limits. No mass or hydronephrosis visualized. Bladder: Foley catheter bladder.  Bladder decompressed. IMPRESSION: No acute or focal abnormality identified. No hydronephrosis. Bladder is decompressed by a Foley catheter. Electronically Signed   By: Marcello Moores  Register   On: 06/16/2016 06:16   Dg Chest Port 1 View  Result Date: 06/14/2016 CLINICAL DATA:  Pneumonia. EXAM: PORTABLE CHEST 1 VIEW COMPARISON:  None. FINDINGS: Patient is rotated to the left. Tracheostomy tube appears in adequate position. There is a right IJ central venous catheter with tip just below the cavoatrial junction. Lungs are adequately inflated with opacification over the left base which may be due to effusion and atelectasis although cannot exclude infection. Mild cardiomegaly. Calcified plaque is present over the aortic arch. There is minimal degenerate change of the spine. IMPRESSION: Left base opacification which may be due to atelectasis and effusion versus infection. Follow-up PA and lateral chest x-ray would be helpful for further evaluation. Cardiomegaly. Aortic atherosclerosis. Tubes and lines as described. Electronically Signed   By: Marin Olp M.D.   On: 06/14/2016 20:08     [facility], DAWOOD M.D on 06/18/2016 at 10:43 AM  Between 7am to 7pm - Pager - [phone]  After 7pm go to [email or web address] - password [facility]  [facility] -  Office  [phone]

## 2016-06-18 NOTE — Progress Notes (Signed)
Confirmed with Dr Izora Gala no CBC post 2nd Unit PRBC.  Will get CBC in am.

## 2016-06-18 NOTE — Plan of Care (Signed)
Problem: Education: Goal: Knowledge of [facility] Education information/materials will improve Outcome: Progressing Patient not actively participating in education, family very involved in care and eager to learn.

## 2016-06-18 NOTE — Progress Notes (Signed)
[facility] Progress Note Patient Name: Danish Ruffins DOB: 09-Sep-1945 MRN: [id number]   Date of Service  06/18/2016  HPI/Events of Note  Hgb drift over several days.  Now 6.9 down from 7.2.  Hematuria  eICU Interventions  Plan: Transfuse 1 unit pRBC Post-transfusion CBC     Intervention Category Intermediate Interventions: Bleeding - evaluation and treatment with blood products  Madisen Ludvigsen 06/18/2016, 6:03 AM

## 2016-06-18 NOTE — Progress Notes (Signed)
[facility] Rounding Note  Interval History:  Transferred from [facility] 6/5. PMH DM, HTN, HLD, GERD. [facility] [place] c/COPD/PNA/UTI. Baseline creatinine April 2018 0.5. To [facility] 04/28/16. Eventual trach 2/2 unable to wean. G-tube. Bilateral effusions requiring Pleurx tube/thoracentesis. AKI preceded [facility] transfer. Exhaustive serologic evaluation neg for GN. HD dependent (started at [place]) 04/30/16-06/01/16, recovered function (creatinine down to 1.3 on 5/24)/TDC removed, then recurrent AKI in setting of HCAP(pseudomonas and corynepbacterium)/bilateral effusions. BUN/creatinine up to 160/2.48. TDC replaced, pt transferred to [facility] for further care/HD. Family meeting at [place] 6/4 - wanted all done. CRRT started on arrival to [facility] 06/14/16.  Today:  CRRT PM of 6/5->AM of 6/7 Making good urine since stopped IVF started yesterday (pt still looking dry) Creatinine OFF CRRT has not bumped back up much yet but VERY low muscle mass and not accurate predictor of eGFR BUN rising - getting large amounts of protein Currently getting transfusion for Hb<7  Objective Vital signs in last 24 hours: Vitals:   06/18/16 0659 06/18/16 0747 06/18/16 0748 06/18/16 0750  BP: (!) 162/57     Pulse: 86   95  Resp: (!) 26   (!) 25  Temp: 98.6 F (37 C)     TempSrc: Oral     SpO2: 95% 100% 100% 100%  Weight:      Height:       Weight change: 27.5 kg (60 lb 10 oz)  Intake/Output Summary (Last 24 hours) at 06/18/16 0818 Last data filed at 06/18/16 0450  Gross per 24 hour  Intake           2517.5 ml  Output             2950 ml  Net           -432.5 ml   Physical Exam:  Blood pressure (!) 162/57, pulse 95, temperature 98.6 F (37 C), temperature source Oral, resp. rate (!) 25, height 5' 9" (1.753 m), weight 102.4 kg (225 lb 12 oz), SpO2 100 %.   Opens eyes but I can't get to follow commands Trach collar Hands mittened TDC/GTube/trach/foley  Overall decreased muscle mass Ant coarse BS but clear Abd with GTube  mild redness at site No edema whatsoever (very poor tissue turgor) Urine now just pink tinged TDC (6/5 placed at [facility])   Recent Labs Lab 06/14/16 1956 06/15/16 0208 06/15/16 1640 06/16/16 0122 06/16/16 1840 06/17/16 0629 06/18/16 0438  NA 133* 133* 137 138 140 142 145  K 4.4 4.0 3.3* 3.7 4.1 3.9 3.7  CL 94* 95* 101 103 104 105 107  CO2 _0 GLUCOSE 229* 247* 119* 181* 220* 252* 180*  BUN 160* 123* 53* 40* 48* 63* 74*  CREATININE 2.48* 1.93* 0.93 0.73 0.83 0.98 0.97  CALCIUM 7.4* 7.6* 7.6* 7.8* 7.6* 7.5* 7.2*  PHOS 5.6* 4.7* 2.4* 2.1*  2.1* 1.9* 2.0*  2.0* 2.6  2.6    Recent Labs Lab 06/14/16 1956  06/16/16 1840 06/17/16 0629 06/18/16 0438  AST 101*  --   --   --   --   ALT 88*  --   --   --   --   ALKPHOS 302*  --   --   --   --   BILITOT 0.9  --   --   --   --   PROT 6.2*  --   --   --   --   ALBUMIN 1.5*  < > 1.6* 1.6* 1.5*  < > =  values in this interval not displayed.  Recent Labs Lab 06/14/16 1956  06/16/16 0122 06/16/16 1840 06/17/16 0629 06/18/16 0438  WBC 24.8*  < > 23.0* 24.6* 22.6* 24.1*  NEUTROABS 22.7*  --   --   --   --   --   HGB 7.3*  < > 8.5* 7.6* 7.2* 6.9*  HCT 21.4*  < > 24.9* 23.8* 23.3* 22.7*  MCV 92.2  < > 96.1 91.5 93.2 95.0  PLT 274  < > 303 320 335 318  < > = values in this interval not displayed.  Recent Labs Lab 06/17/16 1122 06/17/16 1603 06/17/16 2038 06/17/16 2343 06/18/16 0419  GLUCAP 208* 217* 201* 246* 152*   ABG    Component Value Date/Time   PHART 7.424 06/16/2016 1048   PCO2ART 44.2 06/16/2016 1048   PO2ART 62.1 (L) 06/16/2016 1048   HCO3 28.5 (H) 06/16/2016 1048   TCO2 28 06/14/2016 2006   O2SAT 92.3 06/16/2016 1048   Results for TRAVOR, ROYCE (MRN [id number]) as of 06/15/2016 08:01  Ref. Range 06/14/2016 23:18  Appearance Latest Ref Range: CLEAR  CLOUDY (A)  Bacteria, UA Latest Ref Range: NONE SEEN  MANY (A)  Bilirubin Urine Latest Ref Range: NEGATIVE  NEGATIVE  Color, Urine Latest Ref  Range: YELLOW  RED (A)  Glucose Latest Ref Range: NEGATIVE mg/dL NEGATIVE  Hgb urine dipstick Latest Ref Range: NEGATIVE  LARGE (A)  Ketones, ur Latest Ref Range: NEGATIVE mg/dL NEGATIVE  Leukocytes, UA Latest Ref Range: NEGATIVE  LARGE (A)  Nitrite Latest Ref Range: NEGATIVE  NEGATIVE  pH Latest Ref Range: 5.0 - 8.0  8.0  Protein Latest Ref Range: NEGATIVE mg/dL 100 (A)  RBC / HPF Latest Ref Range: 0 - 5 RBC/hpf TOO NUMEROUS TO C...  Specific Gravity, Urine Latest Ref Range: 1.005 - 1.030  1.011  Squamous Epithelial / LPF Latest Ref Range: NONE SEEN  NONE SEEN  WBC, UA Latest Ref Range: 0 - 5 WBC/hpf TOO NUMEROUS TO C...   6/5 Blood cultures MRSA (1), staph coag neg (1) GPC (1) 6/5 Urine culture - multiple spp  6/5 Tracheal aspirate - normal flora  Studies/Results: Ct Head Wo Contrast  Result Date: 06/14/2016 CLINICAL DATA:  Altered mental status EXAM: CT HEAD WITHOUT CONTRAST TECHNIQUE: Contiguous axial images were obtained from the base of the skull through the vertex without intravenous contrast. COMPARISON:  None. FINDINGS: Brain: No mass lesion, intraparenchymal hemorrhage or extra-axial collection. No evidence of acute cortical infarct. There is periventricular hypoattenuation compatible with chronic microvascular disease. There is diffuse atrophy without lobar predominance. Vascular: Atherosclerotic calcification of the vertebral and internal carotid arteries at the skull base. Skull: Normal visualized skull base, calvarium and extracranial soft tissues. Sinuses/Orbits: No sinus fluid levels or advanced mucosal thickening. No mastoid effusion. Normal orbits. IMPRESSION: Chronic microvascular ischemia without acute intracranial abnormality. Electronically Signed   By: Ulyses Jarred M.D.   On: 06/14/2016 21:20   US Renal  Result Date: 06/16/2016 CLINICAL DATA:  Hematuria. EXAM: RENAL / URINARY TRACT ULTRASOUND COMPLETE COMPARISON:  No prior. FINDINGS: Right Kidney: Length: 14.9 cm.  Echogenicity within normal limits. Prominent column of Bertin. If symptoms persist CT can be performed . No mass or hydronephrosis visualized. Left Kidney: Length: 14.7 cm. Echogenicity within normal limits. No mass or hydronephrosis visualized. Bladder: Foley catheter bladder.  Bladder decompressed. IMPRESSION: No acute or focal abnormality identified. No hydronephrosis. Bladder is decompressed by a Foley catheter. Electronically Signed   By: Marcello Moores  Register  On: 06/16/2016 06:16   Dg Chest Port 1 View  Result Date: 06/14/2016 CLINICAL DATA:  Pneumonia. EXAM: PORTABLE CHEST 1 VIEW COMPARISON:  None. FINDINGS: Patient is rotated to the left. Tracheostomy tube appears in adequate position. There is a right IJ central venous catheter with tip just below the cavoatrial junction. Lungs are adequately inflated with opacification over the left base which may be due to effusion and atelectasis although cannot exclude infection. Mild cardiomegaly. Calcified plaque is present over the aortic arch. There is minimal degenerate change of the spine. IMPRESSION: Left base opacification which may be due to atelectasis and effusion versus infection. Follow-up PA and lateral chest x-ray would be helpful for further evaluation. Cardiomegaly. Aortic atherosclerosis. Tubes and lines as described. Electronically Signed   By: Marin Olp M.D.   On: 06/14/2016 20:08    Medications: . sodium chloride Stopped (06/18/16 0100)  . sodium chloride    . cefTAZidime (FORTAZ)  IV Stopped (06/17/16 2206)  . feeding supplement (VITAL AF 1.2 CAL) 1,000 mL (06/18/16 0652)  . levofloxacin (LEVAQUIN) IV Stopped (06/18/16 0050)  . sodium chloride 0.45 % 1,000 mL infusion 75 mL/hr at 06/17/16 2142   . budesonide (PULMICORT) nebulizer solution  0.5 mg Nebulization BID  . chlorhexidine gluconate (MEDLINE KIT)  15 mL Mouth Rinse BID  . Chlorhexidine Gluconate Cloth  6 each Topical Daily  . collagenase   Topical Daily  . darbepoetin  (ARANESP) injection - NON-DIALYSIS  200 mcg Subcutaneous Q Wed-1800  . feeding supplement (PRO-STAT SUGAR FREE 64)  30 mL Per Tube 5 X Daily  . furosemide  20 mg Intravenous Once  . furosemide  20 mg Intravenous Once  . insulin aspart  0-15 Units Subcutaneous Q4H  . ipratropium-albuterol  3 mL Nebulization Q6H  . mouth rinse  15 mL Mouth Rinse QID  . metoprolol tartrate  100 mg Per Tube BID  . midazolam  2 mg Intravenous Once  . pantoprazole (PROTONIX) IV  40 mg Intravenous QHS   Background Summary:  PMH DM, HTN, HLD, GERD.  [facility] [place] c/COPD/PNA/UTI.  Baseline creatinine April 2018 0.5.  To [facility] 04/28/16 with AKI, trach 2/2 unable to wean. G-tube. Bilateral effusions requiring Pleurx tube/thoracentesis.  Exhaustive serologic evaluation neg for GN after arrival to [facility] (short of renal bx).  HD dependent 04/30/16-06/01/16, creatinine down to 1.3 on 5/24, TDC removed  Recurrent AKI 06/2016 in setting of HCAP(pseudomonas and corynebacterium)/bilateral effusions/poss UTI/presumed sepsis/volume depletion. BUN/creatinine  to 160/2.48.  TDC replaced 6/5, pt transferred to [facility] for further care/HD.  Family meeting at [place] 6/4 - wanted all done.  CRRT started on arrival to [facility] 06/14/16-stopped 6/7.   1. AKI - Recurrent. (2nd episode requiring dialysis) 2/2 sepsis/volume depletion. Had CRRT mainly for severe azotemia/hyperkalemia  6/5->6/7. Now non-oliguric and creatinine still <1 post D/C of CRRT 1. With low muscle mass creatinine low misleading not good predictor of eGFR  2. Current IVF 1/2 NS at 75/hour + tube feeds - O>I and still looks dry on exam - increase fluids to 100 cc/hour 3. BUN rising disproportionate to creatinine - getting LARGE amounts protein due to wounds (Vital TF + prostat 5X/day) - ask dietary to change TF's and/or reduce prostat to reduce protein loading (hoping won't need more HD) - I have reduced prostat to twice a day pending RD input  2. Hypernatremia  - mild -  increasing 1/2 NS to 100. May need extra free water via tube 3. Gross hematuria - occurred when foley  changed out (purulent in foley prior to change). Nothing grossly on [place]. Urine clearing. Culture mult spp 4. VDRF/trach - CCM managing.  5. ID: HCAP/MRSE bacteremia  1. ([facility] cultures + CRE pseudomonas, corynebacterium).   2. [facility] 6/5 BC+ MRSE (1) CNS (1) GPC (1)  3. Current ATB's fortaz/levaquin. Vanco stopped 6. HTN - BB started back for HTN/tachycardia 7. PAF - dig stopped. (not good agent w/fluctuating GFR) 8. Anemia - Aranesp at [facility] (resumed here 200/week).   1. Large drop in Hb at [facility] prior to transfer (11->8)  2. Transfused 6/6 and 6/9 for Hb <7 3. Gross hematuria better, no other obvious source of loss 9. Contact isolation  1. CRE pseudomonas at [facility];  2. [facility] - MRSA PCR +BC+ MRSE 10. Encephalopathy - opens eyes. Not following commands for me. Better than on admission. Not sure what "best recent baseline" is.   Jamal Maes, MD [facility] [phone] pager 06/18/2016, 8:18 AM

## 2016-06-18 NOTE — Progress Notes (Addendum)
Nutrition Consult/Follow Up  INTERVENTION:    Continue Vital AF 1.2 at 50 ml/hr with Prostat 30 ml BID  TF regimen providing 1640 kcals, 120 gm protein, 973 ml of free water  NUTRITION DIAGNOSIS:   Inadequate oral intake related to inability to eat as evidenced by NPO status, ongoing   GOAL:   Patient will meet greater than or equal to 90% of their needs, progressing  MONITOR:   TF tolerance, Skin, I & O's, Labs  ASSESSMENT:   Pt with PMH of COPD and DM who was admitted 4/18 through [place] for severe COPD tx to [facility], worsened and required intubation 4/21 likely due to severe COPD and possible PNA, trach placed 4/27, developed bilateral pleural effusions and is s/p pleurex catheter and thoracentesis. Pt has required HD for worsening renal failure on HD 4/21-5/23. Pt transferred to [facility] for further care.    RD consulted per Nephrology to decrease amount of protein in TF regimen.  Pt now on trach collar.  Nephrology note reviewed.  CRRT stopped 6/7.  ARF improving. Current TF regimen: Vital AF 1.2 at 50 ml/hr with Prostat 30 ml BID via PEG tube.  SLP consulted for swallow assessment/PMV. Labs and medications reviewed. CBG's [phone].  Spoke with Dr. Lorrene Reid.  Plan is to monitor renal function, increase protein as able.  Diet Order:  Diet NPO time specified  Skin:   ( unstageable wound on sacrum, MASD perineum, multiple areas of DTI to upper buttocks)  Last BM:  6/8  Height:   Ht Readings from Last 1 Encounters:  06/15/16 5\' 9"  (1.753 m)   Weight:   Wt Readings from Last 1 Encounters:  06/18/16 225 lb 12 oz (102.4 kg)   Ideal Body Weight:  72.7 kg  BMI:  Body mass index is 33.34 kg/m.  Estimated Nutritional Needs:   Kcal:  1700-1900  Protein:  >/= 136 gm  Fluid:  per MD  EDUCATION NEEDS:   No education needs identified at this time  Arthur Holms, RD, LDN Pager #: [phone] After-Hours Pager #: [phone]

## 2016-06-19 ENCOUNTER — Inpatient Hospital Stay (HOSPITAL_COMMUNITY): Payer: Medicare Other

## 2016-06-19 DIAGNOSIS — R310 Gross hematuria: Secondary | ICD-10-CM

## 2016-06-19 DIAGNOSIS — D72829 Elevated white blood cell count, unspecified: Secondary | ICD-10-CM

## 2016-06-19 LAB — TYPE AND SCREEN
ABO/RH(D): O POS
Antibody Screen: NEGATIVE
UNIT DIVISION: 0
Unit division: 0
Unit division: 0

## 2016-06-19 LAB — RENAL FUNCTION PANEL
ALBUMIN: 1.7 g/dL — AB (ref 3.5–5.0)
Anion gap: 7 (ref 5–15)
BUN: 82 mg/dL — AB (ref 6–20)
CO2: 29 mmol/L (ref 22–32)
Calcium: 7.2 mg/dL — ABNORMAL LOW (ref 8.9–10.3)
Chloride: 108 mmol/L (ref 101–111)
Creatinine, Ser: 1.06 mg/dL (ref 0.61–1.24)
GFR calc Af Amer: >60 mL/min (ref >60)
GFR calc non Af Amer: >60 mL/min (ref >60)
GLUCOSE: 273 mg/dL — AB (ref 65–99)
PHOSPHORUS: 2.7 mg/dL (ref 2.5–4.6)
Potassium: 3.9 mmol/L (ref 3.5–5.1)
SODIUM: 144 mmol/L (ref 135–145)

## 2016-06-19 LAB — BPAM RBC
BLOOD PRODUCT EXPIRATION DATE: 201806282359
Blood Product Expiration Date: 201806132359
Blood Product Expiration Date: 201806262359
ISSUE DATE / TIME: 201806062024
ISSUE DATE / TIME: 201806091103
ISSUE DATE / TIME: 201806090629
UNIT TYPE AND RH: 5100
Unit Type and Rh: 5100
Unit Type and Rh: 5100

## 2016-06-19 LAB — CULTURE, BLOOD (ROUTINE X 2)
CULTURE: NO GROWTH
SPECIAL REQUESTS: ADEQUATE

## 2016-06-19 LAB — CBC
HCT: 27.2 % — ABNORMAL LOW (ref 39.0–52.0)
HEMOGLOBIN: 9.3 g/dL — AB (ref 13.0–17.0)
MCH: 33.9 pg (ref 26.0–34.0)
MCHC: 34.2 g/dL (ref 30.0–36.0)
MCV: 99.3 fL (ref 78.0–100.0)
PLATELETS: 276 10*3/uL (ref 150–400)
RBC: 2.74 MIL/uL — AB (ref 4.22–5.81)
RDW: 19 % — ABNORMAL HIGH (ref 11.5–15.5)
WBC: 28.6 10*3/uL — AB (ref 4.0–10.5)

## 2016-06-19 LAB — GLUCOSE, CAPILLARY
GLUCOSE-CAPILLARY: 159 mg/dL — AB (ref 65–99)
GLUCOSE-CAPILLARY: 172 mg/dL — AB (ref 65–99)
GLUCOSE-CAPILLARY: 179 mg/dL — AB (ref 65–99)
Glucose-Capillary: 218 mg/dL — ABNORMAL HIGH (ref 65–99)
Glucose-Capillary: 171 mg/dL — ABNORMAL HIGH (ref 65–99)

## 2016-06-19 MED ORDER — LEVOFLOXACIN IN D5W 500 MG/100ML IV SOLN
500.0000 mg | INTRAVENOUS | Status: DC
  Administered 2016-06-19: 500 mg via INTRAVENOUS
  Filled 2016-06-19: qty 100

## 2016-06-19 MED ORDER — HEPARIN SODIUM (PORCINE) 5000 UNIT/ML IJ SOLN
5000.0000 [IU] | Freq: Three times a day (TID) | INTRAMUSCULAR | Status: DC
  Administered 2016-06-19 – 2016-06-21 (×7): 5000 [IU] via SUBCUTANEOUS
  Filled 2016-06-19 (×4): qty 1

## 2016-06-19 MED ORDER — SODIUM CHLORIDE 0.45 % IV SOLN
INTRAVENOUS | Status: DC
  Administered 2016-06-19 – 2016-06-20 (×3): via INTRAVENOUS

## 2016-06-19 MED ORDER — INSULIN GLARGINE 100 UNIT/ML ~~LOC~~ SOLN
10.0000 [IU] | Freq: Every day | SUBCUTANEOUS | Status: DC
  Administered 2016-06-19 – 2016-06-21 (×3): 10 [IU] via SUBCUTANEOUS
  Filled 2016-06-19 (×3): qty 0.1

## 2016-06-19 MED ORDER — LEVOFLOXACIN IN D5W 500 MG/100ML IV SOLN: 500.0000 mg | INTRAVENOUS | Status: DC

## 2016-06-19 NOTE — Progress Notes (Signed)
[facility] Rounding Note  Pre Admission/Transfer History:  Transferred from [place] 6/5. PMH DM, HTN, HLD, GERD. [facility] [place] c/COPD/PNA/UTI. Baseline creatinine April 2018 0.5. To [facility] 04/28/16. Eventual trach 2/2 unable to wean. G-tube. Bilateral effusions requiring Pleurx tube/thoracentesis. AKI preceded [facility] transfer. Exhaustive serologic evaluation neg for GN. HD dependent (started at [place]) 04/30/16-06/01/16, recovered function (creatinine down to 1.3 on 5/24)/TDC removed, then recurrent AKI in setting of HCAP(pseudomonas and corynepbacterium)/bilateral effusions. BUN/creatinine up to 160/2.48. TDC replaced, pt transferred to [facility] for further care/HD. Family meeting at [place] 6/4 - wanted all done.   Today:  CRRT 6/5->6/7 IVF's started d/t vol depletion by exam Excellent UOP Creatinine very small increase, BUN increase disproportonate (VERY low muscle mass so creatinine not accurate predictor of eGFR AND getting large amounts of protein pushing BUN up) Transfusion yesterday for low Hb  Objective Vital signs in last 24 hours: Temp:  [98.9 F (37.2 C)-100 F (37.8 C)] 98.9 F (37.2 C) (06/10 0800) Pulse Rate:  [62-101] 77 (06/10 0800) Cardiac Rhythm: Normal sinus rhythm (06/10 0724) Resp:  [12-32] 24 (06/10 0800) BP: (116-182)/(47-74) 127/57 (06/10 0800) SpO2:  [92 %-100 %] 98 % (06/10 0800) FiO2 (%):  [40 %] 40 % (06/10 0800) Weight:  [72.5 kg (159 lb 13.3 oz)] 72.5 kg (159 lb 13.3 oz) (06/10 0425) Weight change: -29.9 kg (-65 lb 14.7 oz)  Intake/Output Summary (Last 24 hours) at 06/19/16 1013 Last data filed at 06/19/16 0600  Gross per 24 hour  Intake             2215 ml  Output             3180 ml  Net             -965 ml   Physical Exam:  Blood pressure (!) 127/57, pulse 77, temperature 98.9 F (37.2 C), temperature source Oral, resp. rate (!) 24, height '5\' 9"'  (1.753 m), weight 72.5 kg (159 lb 13.3 oz), SpO2 98 %.   Opens eyes but does not follow any commands Trach  collar Hands mittened TDC/GTube/trach/foley  Overall decreased muscle mass Ant coarse BS but clear Abd with GTube  Still no edema whatsoever (very poor tissue turgor) Urine now just pink tinged (had gross hematuria after foley change at time of transfer) [facility] (6/5 placed at [facility])   Recent Labs Lab 06/15/16 0208 06/15/16 1640 06/16/16 0122 06/16/16 1840 06/17/16 0629 06/18/16 0438 06/19/16 0127  NA 133* 137 138 140 142 145 144  K 4.0 3.3* 3.7 4.1 3.9 3.7 3.9  CL 95* 101 103 104 105 107 108  CO2 '26 26 27 28 28 29 29  ' GLUCOSE 247* 119* 181* 220* 252* 180* 273*  BUN 123* 53* 40* 48* 63* 74* 82*  CREATININE 1.93* 0.93 0.73 0.83 0.98 0.97 1.06  CALCIUM 7.6* 7.6* 7.8* 7.6* 7.5* 7.2* 7.2*  PHOS 4.7* 2.4* 2.1*  2.1* 1.9* 2.0*  2.0* 2.6  2.6 2.7    Recent Labs Lab 06/14/16 1956  06/17/16 0629 06/18/16 0438 06/19/16 0127  AST 101*  --   --   --   --   ALT 88*  --   --   --   --   ALKPHOS 302*  --   --   --   --   BILITOT 0.9  --   --   --   --   PROT 6.2*  --   --   --   --   ALBUMIN 1.5*  < >  1.6* 1.5* 1.7*  < > = values in this interval not displayed.  Recent Labs Lab 06/14/16 1956  06/16/16 1840 06/17/16 0629 06/18/16 0438 06/19/16 0739  WBC 24.8*  < > 24.6* 22.6* 24.1* 28.6*  NEUTROABS 22.7*  --   --   --   --   --   HGB 7.3*  < > 7.6* 7.2* 6.9* 9.3*  HCT 21.4*  < > 23.8* 23.3* 22.7* 27.2*  MCV 92.2  < > 91.5 93.2 95.0 99.3  PLT 274  < > 320 335 318 276  < > = values in this interval not displayed.  Recent Labs Lab 06/18/16 1838 06/18/16 1946 06/18/16 2017 06/18/16 2355 06/19/16 0351  GLUCAP 188* 216* 220* 244* 218*   ABG    Component Value Date/Time   PHART 7.424 06/16/2016 1048   PCO2ART 44.2 06/16/2016 1048   PO2ART 62.1 (L) 06/16/2016 1048   HCO3 28.5 (H) 06/16/2016 1048   TCO2 28 06/14/2016 2006   O2SAT 92.3 06/16/2016 1048   6/5 Blood cultures MRSE 1/2 6/5 Urine culture - multiple spp  6/5 Tracheal aspirate - normal  flora  Studies/Results: US Renal  Result Date: 06/16/2016 CLINICAL DATA:  Hematuria. EXAM: RENAL / URINARY TRACT ULTRASOUND COMPLETE COMPARISON:  No prior. FINDINGS: Right Kidney: Length: 14.9 cm. Echogenicity within normal limits. Prominent column of Bertin. If symptoms persist CT can be performed . No mass or hydronephrosis visualized. Left Kidney: Length: 14.7 cm. Echogenicity within normal limits. No mass or hydronephrosis visualized. Bladder: Foley catheter bladder.  Bladder decompressed. IMPRESSION: No acute or focal abnormality identified. No hydronephrosis. Bladder is decompressed by a Foley catheter. Electronically Signed   By: Marcello Moores  Register   On: 06/16/2016 06:16    Medications: . sodium chloride 100 mL/hr at 06/19/16 0909  . sodium chloride Stopped (06/18/16 0100)  . cefTAZidime (FORTAZ)  IV Stopped (06/18/16 2330)  . feeding supplement (VITAL AF 1.2 CAL) 1,000 mL (06/19/16 0415)  . levofloxacin (LEVAQUIN) IV     . budesonide (PULMICORT) nebulizer solution  0.5 mg Nebulization BID  . chlorhexidine gluconate (MEDLINE KIT)  15 mL Mouth Rinse BID  . Chlorhexidine Gluconate Cloth  6 each Topical Daily  . collagenase   Topical Daily  . darbepoetin (ARANESP) injection - NON-DIALYSIS  200 mcg Subcutaneous Q Wed-1800  . feeding supplement (PRO-STAT SUGAR FREE 64)  30 mL Per Tube BID  . heparin subcutaneous  5,000 Units Subcutaneous Q8H  . insulin aspart  0-15 Units Subcutaneous Q4H  . insulin glargine  10 Units Subcutaneous Daily  . ipratropium-albuterol  3 mL Nebulization Q6H  . mouth rinse  15 mL Mouth Rinse QID  . metoprolol tartrate  100 mg Per Tube BID  . midazolam  2 mg Intravenous Once  . pantoprazole (PROTONIX) IV  40 mg Intravenous QHS   Background Summary:  PMH DM, HTN, HLD, GERD.  [facility] [place] c/COPD/PNA/UTI. Baseline creatinine April 2018 0.5.  To [facility] 04/28/16 with AKI, trach 2/2 unable to wean. G-tube. Bilateral effusions requiring Pleurx  tube/thoracentesis.  Exhaustive serologic evaluation neg for GN after arrival to [facility] (short of renal bx). HD dependent 04/30/16-06/01/16, creatinine down to 1.3 on 5/24, TDC removed   Recurrent AKI 06/2016 in setting of HCAP(pseudomonas and corynebacterium)/bilateral effusions/poss UTI/presumed sepsis/volume depletion. BUN/creatinine  to 160/2.48. TDC replaced 6/5, pt transferred to [facility] for further care/HD.  Family meeting at [place] 6/4 - wanted all done. CRRT started on arrival to [facility] 06/14/16-stopped 6/7.   1. AKI - Recurrent. (2nd  episode requiring dialysis) 2/2 sepsis/volume depletion. Had CRRT for severe azotemia/hyperkalemia  6/5->6/7. Volume depleted and getting IVF's. Now non-oliguric and creatinine min rise since stopping CRRT but steady rise BUN 1. Good UOP w/1/2 NS at 100/hour (but output still > input and weight down 8 kg since admission) 2. Low muscle mass -->creatinine not good predictor of eGFR; eval 24 hour urine CCr  3. BUN rising disproportionate to creatinine - getting LARGE amounts protein due to wounds (Vital TF + prostat 5X/day) - Prostat reduced to twice a day for 5x/day, still on VITAL at 50. RD not wanting to reduce protein further (may be causing osmotic diuresis)  2. Hypernatremia  - mild - improved with increase in IVF rate.  May need extra free water via tube 3. Gross hematuria - 2/2 foley change on adm. Urine clearing. Culture mult spp.  4. VDRF/trach - on trach collar. Incr O2 req. Needs f/u CXR.  5. ID: HCAP/MRSE BC + 1/2. Resp cultures normal flora. Urine culture mult spp. ([facility] cx pseudomonas. Corynebacterium)  Current ATB's fortaz/levaquin. Vanco stopped 6. HTN - BB started back for HTN/tachycardia 7. PAF - dig stopped. (not good agent w/fluctuating GFR) 8. Anemia - Aranesp at [facility] (resumed here 200/week).   1. Large drop in Hb at [facility] prior to transfer (11->8)  2. Transfused 6/6 and 6/9 for Hb <7 3. Gross hematuria better, no other obvious source of  loss 9. Contact isolation  1. CRE pseudomonas at [facility]; [facility] - MRSA PCR neg. BC + 1/2 MRSE.  10. Encephalopathy - opens eyes. Not following commands for me. Better than on admission. Not sure what "best recent baseline" is.   Jamal Maes, MD [facility] [phone] pager 06/19/2016, 10:13 AM

## 2016-06-19 NOTE — Evaluation (Signed)
Passy-Muir Speaking Valve - Evaluation Patient Details  Name: Bryan Frank MRN: [id number] Date of Birth: 04/06/1945  Today's Date: 06/19/2016 Time: 1022-1039 SLP Time Calculation (min) (ACUTE ONLY): 17 min  Past Medical History:  Past Medical History:  Diagnosis Date  . Depression   . Diabetes mellitus ([place])    from [facility] Chart  . Hypertension   . Pneumonia    Past Surgical History: History reviewed. No pertinent surgical history. HPI:  71 year old male with PMH: pna, DM, HTN, GERD and tobacco abuse. Per chart recent admit to [place], [place] hospital 04/2016 for severe COPD exacerbation and dense pneumonia, transferred to [facility]. On 4/21 condition worsened requiring intubation likely due to severe COPD and possible PNA; tracheostomy and PEG placed 4/27.   Assessment / Plan / Recommendation Clinical Impression  Pt with decreased sustained attention to SLP; will focus attention when name called. Vitals stable at baseline. Cuff deflated and valve placed where it remained until SLP removed to test for back pressure/air trapping which was not detected with each removal. Spo2 93-97%, RR 20 and HR 75. Pt did not phonate spontaneously or on command despite techniques via SLP to elicit. Phonation present during coughing and expecoration of mucous via trach. Recommend pt wear valve with SLP only and will likely liberalize soon (when ST can observe for longer duration with valve). If continues to tolerate speaking valve will initiate swallow assessment during next session.   SLP Visit Diagnosis: Aphonia (R49.1)    SLP Assessment  Patient needs continued Speech Lanaguage Pathology Services    Follow Up Recommendations   (TBD)    Frequency and Duration min 2x/week  2 weeks    PMSV Trial PMSV was placed for: 12 min Able to redirect subglottic air through upper airway: Yes Able to Attain Phonation: No attempt to phonate (phonation heard during cough) Voice Quality:  (will assess  ) Able to Expectorate Secretions: Yes Level of Secretion Expectoration with PMSV: Tracheal Breath Support for Phonation: Mildly decreased Intelligibility: Unable to assess (comment) Respirations During Trial: 20 SpO2 During Trial:  (93-97%) Pulse During Trial: 75 Behavior: Alert;Confused;Poor eye contact;No attempt to communicate   Tracheostomy Tube       Vent Dependency  FiO2 (%): 40 %    Cuff Deflation Trial  GO Tolerated Cuff Deflation: Yes Length of Time for Cuff Deflation Trial: 15 min Behavior: Alert;Confused;Poor eye contact        [name] 06/19/2016, 10:59 AM   [name].Ed [employer] [phone]

## 2016-06-19 NOTE — Progress Notes (Signed)
Patient more alert and moving all extremities.  Mittens taken off for 4hrs hours then  placed back on for patient placing hand near foley catheter and could not follow instructions to move hands away from foley.  24 hr Urine Creatinine in progress.

## 2016-06-19 NOTE — Progress Notes (Signed)
PROGRESS NOTE                                                                                                                                                                                                             Patient Demographics:    Bryan Frank, is a 71 y.o. male, DOB - Jan 31, 1945, SXJ:[id number]  Admit date - 06/14/2016   Admitting Physician Javier Glazier, MD  Outpatient Primary MD for the patient is No primary care provider on file.  LOS - 5   No chief complaint on file.      Brief Narrative   71 year old male with PMH as below, which is significant for DM2, HLD, GERD, HTN, and tobacco abuse. It appears as though he was admitted to [place], [place] hospital 04/2016 for severe COPD exacerbation and dense pneumonia. Also concern for UTI and treated with broad spectrum antibiotics. He improved and was transferred to [facility] in [place] 4/19. At that time he was on supplemental O2 during the day and BiPAP at night. On 4/21 his condition worsened requiring intubation. Likely due to severe COPD and possible PNA. He was unable to wean and had tracheostomy placed 4/27. He was found to have bilateral pleural effusions and is s/p pleurex catheter and thoracentesis. Cytology so far negative. He also required HD for worsening renal failure. This continued to worsen. Course also complicated by copious respiratory secretions. 6/5 he was transferred to [facility] at family request for further care and continued hemodialysis.    Subjective:    Bryan Frank today Is non-verbal, cannot provide any complaints, no significant events overnight, received 2 units PRBC yesterday     Assessment  & Plan :    Active Problems:   ESRD (end stage renal disease) ([place])   Acute on chronic alteration in mental status   PNA (pneumonia)   Tracheostomy care ([place])   Pressure injury of skin  Trach dependent respiratory failure - Off the vent for last728  hours, currently on ATC 40%. - In the setting of severe COPD at baseline, worsening secondary to pulmonary edema and HCAP, and bilateral pleural effusion. - Managment per pulmonary, currently trach dependent. - Increased oxygen requirement from 35-40%, will obtain portable chest x-ray  COPD - No active wheezing, continue with scheduled DuoNeb's, and Pulmicort  HCAP - cultures pseudomonas,  klebsiella, and corynebacterium (no sens done). Had been worsening at [facility] despite treatment with cefepime. - Discussed with PCCM, continue ceftaz and levofloxacin for total of 10 days - Patient continues to have worsening leukocytosis, but clinically appears to be improving, will repeat chest x-ray today.  AKI - Management per renal, required dialysis at [facility], and CRRT upon presentation to [facility], creatinine remained stable off CRRT, management per renal - Started on half-normal saline at 100 mL/h  Hematuria - Secondary to traumatic Foley, appears to be improving, but still passing blood clots whenever flushed, to continue with flushing Foley 3 times a day.  MRSA bacteremia - 1/2 bottles, most likely contaminant  Hypertension - Blood pressure improved, continue with metoprolol  Paroxysmal A. Fib - Rate controlled on metoprolol,Hold off on anticoagulation until hematuria resolves   Acute encephalopathy -  etiology unclear but suspect multifactorial: Medications, sepsis, hypercapnia, progressive renal dysfunction  Anemia -  gross hematuria contributing, received 2 units PRBC on 6/10, hemoglobin improved from 6.9-9.3   Sacral pressure ulcer - Continue with wound care  Code Status :full  Family Communication  : none at bedside  Disposition Plan  : pending further work up.  Consults  :  PCCM, Renal  Procedures  : CRRT  DVT Prophylaxis  : [place] heparin  - SCDs   Lab Results  Component Value Date   PLT 276 06/19/2016    Antibiotics  :   Anti-infectives    Start      Dose/Rate Route Frequency Ordered Stop   06/19/16 2300  levofloxacin (LEVAQUIN) IVPB 500 mg  Status:  Discontinued     500 mg 100 mL/hr over 60 Minutes Intravenous Every 24 hours 06/19/16 0827 06/19/16 0829   06/19/16 2300  levofloxacin (LEVAQUIN) IVPB 500 mg     500 mg 100 mL/hr over 60 Minutes Intravenous Every 48 hours 06/19/16 0829     06/17/16 2300  levofloxacin (LEVAQUIN) IVPB 500 mg  Status:  Discontinued     500 mg 100 mL/hr over 60 Minutes Intravenous Every 48 hours 06/16/16 0824 06/19/16 0827   06/17/16 1345  cefTAZidime (FORTAZ) 2 g in dextrose 5 % 50 mL IVPB     2 g 100 mL/hr over 30 Minutes Intravenous Every 12 hours 06/17/16 1333     06/16/16 2200  cefTAZidime (FORTAZ) 1 g in dextrose 5 % 50 mL IVPB  Status:  Discontinued     1 g 100 mL/hr over 30 Minutes Intravenous Every 24 hours 06/16/16 0824 06/16/16 1409   06/16/16 2200  cefTAZidime (FORTAZ) 2 g in dextrose 5 % 50 mL IVPB  Status:  Discontinued     2 g 100 mL/hr over 30 Minutes Intravenous Every 24 hours 06/16/16 1409 06/17/16 1333   06/15/16 2200  Levofloxacin (LEVAQUIN) IVPB 250 mg  Status:  Discontinued     250 mg 50 mL/hr over 60 Minutes Intravenous Every 24 hours 06/14/16 2108 06/16/16 0824   06/14/16 2300  vancomycin (VANCOCIN) 1,250 mg in sodium chloride 0.9 % 250 mL IVPB     1,250 mg 166.7 mL/hr over 90 Minutes Intravenous  Once 06/14/16 2108 06/15/16 0030   06/14/16 2300  vancomycin (VANCOCIN) IVPB 750 mg/150 ml premix  Status:  Discontinued     750 mg 150 mL/hr over 60 Minutes Intravenous Every 24 hours 06/14/16 2108 06/16/16 0824   06/14/16 2200  levofloxacin (LEVAQUIN) IVPB 500 mg     500 mg 100 mL/hr over 60 Minutes Intravenous  Once 06/14/16 2108 06/14/16  2330   06/14/16 2200  cefTAZidime (FORTAZ) 2 g in dextrose 5 % 50 mL IVPB  Status:  Discontinued     2 g 100 mL/hr over 30 Minutes Intravenous Every 12 hours 06/14/16 2108 06/16/16 0824        Objective:   Vitals:   06/19/16 0425 06/19/16 0731  06/19/16 0735 06/19/16 0800  BP:   (!) 138/57 (!) 127/57  Pulse:   78 77  Resp:   (!) 25 (!) 24  Temp:    98.9 F (37.2 C)  TempSrc:    Oral  SpO2:  96% 100% 98%  Weight: 72.5 kg (159 lb 13.3 oz)     Height:        Wt Readings from Last 3 Encounters:  06/19/16 72.5 kg (159 lb 13.3 oz)     Intake/Output Summary (Last 24 hours) at 06/19/16 1006 Last data filed at 06/19/16 0600  Gross per 24 hour  Intake             2215 ml  Output             3180 ml  Net             -965 ml     Physical Exam  Open eyes, more responsive today, was able to follow simple commands,  Aerosolized trach collar, with no secretions from trach Good air entry bilaterally, no wheezing, has TDC in right upper  Chest Regular rate and rhythm, no rubs murmurs gallops Abdomen soft, nontender, nondistended, bowel sounds present, some mild erythema around PEG  Site Extremities with no edema, clubbing or cyanosis, has significant muscle wasting, and delayed skin turgor   Data Review:    CBC  Recent Labs Lab 06/14/16 1956  06/16/16 0122 06/16/16 1840 06/17/16 0629 06/18/16 0438 06/19/16 0739  WBC 24.8*  < > 23.0* 24.6* 22.6* 24.1* 28.6*  HGB 7.3*  < > 8.5* 7.6* 7.2* 6.9* 9.3*  HCT 21.4*  < > 24.9* 23.8* 23.3* 22.7* 27.2*  PLT 274  < > 303 320 335 318 276  MCV 92.2  < > 96.1 91.5 93.2 95.0 99.3  MCH 31.5  < > 32.8 29.2 28.8 28.9 33.9  MCHC 34.1  < > 34.1 31.9 30.9 30.4 34.2  RDW 17.1*  < > 16.6* 16.9* 17.0* 17.5* 19.0*  LYMPHSABS 1.2  --   --   --   --   --   --   MONOABS 0.7  --   --   --   --   --   --   EOSABS 0.0  --   --   --   --   --   --   BASOSABS 0.2*  --   --   --   --   --   --   < > = values in this interval not displayed.  Chemistries   Recent Labs Lab 06/14/16 1956 06/15/16 0208  06/16/16 0122 06/16/16 1840 06/17/16 0629 06/18/16 0438 06/19/16 0127  NA 133* 133*  < > 138 140 142 145 144  K 4.4 4.0  < > 3.7 4.1 3.9 3.7 3.9  CL 94* 95*  < > 103 104 105 107 108  CO2 25  26  < > _0 GLUCOSE 229* 247*  < > 181* 220* 252* 180* 273*  BUN 160* 123*  < > 40* 48* 63* 74* 82*  CREATININE 2.48* 1.93*  < > 0.73 0.83 0.98 0.97  1.06  CALCIUM 7.4* 7.6*  < > 7.8* 7.6* 7.5* 7.2* 7.2*  MG 2.0 2.0  --  1.9 1.7 1.6* 1.4*  --   AST 101*  --   --   --   --   --   --   --   ALT 88*  --   --   --   --   --   --   --   ALKPHOS 302*  --   --   --   --   --   --   --   BILITOT 0.9  --   --   --   --   --   --   --   < > = values in this interval not displayed. ------------------------------------------------------------------------------------------------------------------ No results for input(s): CHOL, HDL, LDLCALC, TRIG, CHOLHDL, LDLDIRECT in the last 72 hours.  No results found for: HGBA1C ------------------------------------------------------------------------------------------------------------------ No results for input(s): TSH, T4TOTAL, T3FREE, THYROIDAB in the last 72 hours.  Invalid input(s): FREET3 ------------------------------------------------------------------------------------------------------------------ No results for input(s): VITAMINB12, FOLATE, FERRITIN, TIBC, IRON, RETICCTPCT in the last 72 hours.  Coagulation profile  Recent Labs Lab 06/14/16 1956  INR 1.22    No results for input(s): DDIMER in the last 72 hours.  Cardiac Enzymes No results for input(s): CKMB, TROPONINI, MYOGLOBIN in the last 168 hours.  Invalid input(s): CK ------------------------------------------------------------------------------------------------------------------ No results found for: BNP  Inpatient Medications  Scheduled Meds: . budesonide (PULMICORT) nebulizer solution  0.5 mg Nebulization BID  . chlorhexidine gluconate (MEDLINE KIT)  15 mL Mouth Rinse BID  . Chlorhexidine Gluconate Cloth  6 each Topical Daily  . collagenase   Topical Daily  . darbepoetin (ARANESP) injection - NON-DIALYSIS  200 mcg Subcutaneous Q Wed-1800  . feeding supplement  (PRO-STAT SUGAR FREE 64)  30 mL Per Tube BID  . insulin aspart  0-15 Units Subcutaneous Q4H  . insulin glargine  10 Units Subcutaneous Daily  . ipratropium-albuterol  3 mL Nebulization Q6H  . mouth rinse  15 mL Mouth Rinse QID  . metoprolol tartrate  100 mg Per Tube BID  . midazolam  2 mg Intravenous Once  . pantoprazole (PROTONIX) IV  40 mg Intravenous QHS   Continuous Infusions: . sodium chloride 100 mL/hr at 06/19/16 0909  . sodium chloride Stopped (06/18/16 0100)  . cefTAZidime (FORTAZ)  IV Stopped (06/18/16 2330)  . feeding supplement (VITAL AF 1.2 CAL) 1,000 mL (06/19/16 0415)  . levofloxacin (LEVAQUIN) IV     PRN Meds:.sodium chloride, metoprolol tartrate  Micro Results Recent Results (from the past 240 hour(s))  MRSA PCR Screening     Status: None   Collection Time: 06/14/16  7:00 PM  Result Value Ref Range Status   MRSA by PCR NEGATIVE NEGATIVE Final    Comment:        The GeneXpert MRSA Assay (FDA approved for NASAL specimens only), is one component of a comprehensive MRSA colonization surveillance program. It is not intended to diagnose MRSA infection nor to guide or monitor treatment for MRSA infections.   Culture, Urine     Status: Abnormal   Collection Time: 06/14/16  7:34 PM  Result Value Ref Range Status   Specimen Description URINE, RANDOM  Final   Special Requests NONE  Final   Culture MULTIPLE SPECIES PRESENT, SUGGEST RECOLLECTION (A)  Final   Report Status 06/16/2016 FINAL  Final  Culture, respiratory (NON-Expectorated)     Status: None   Collection Time: 06/14/16  7:34 PM  Result Value Ref  Range Status   Specimen Description TRACHEAL ASPIRATE  Final   Special Requests NONE  Final   Gram Stain   Final    MODERATE WBC PRESENT, PREDOMINANTLY PMN FEW SQUAMOUS EPITHELIAL CELLS PRESENT FEW GRAM POSITIVE RODS RARE GRAM NEGATIVE RODS RARE GRAM POSITIVE COCCI IN PAIRS    Culture Consistent with normal respiratory flora.  Final   Report Status  06/16/2016 FINAL  Final  Culture, blood (Routine X 2) w Reflex to ID Panel     Status: Abnormal   Collection Time: 06/14/16  7:40 PM  Result Value Ref Range Status   Specimen Description BLOOD RIGHT ANTECUBITAL  Final   Special Requests IN PEDIATRIC BOTTLE Blood Culture adequate volume  Final   Culture  Setup Time   Final    GRAM POSITIVE COCCI IN PEDIATRIC BOTTLE CRITICAL RESULT CALLED TO, READ BACK BY AND VERIFIED WITH: T DANG PHARMD 1920 06/15/16 A BROWNING    Culture (A)  Final    STAPHYLOCOCCUS SPECIES (COAGULASE NEGATIVE) THE SIGNIFICANCE OF ISOLATING THIS ORGANISM FROM A SINGLE SET OF BLOOD CULTURES WHEN MULTIPLE SETS ARE DRAWN IS UNCERTAIN. PLEASE NOTIFY THE MICROBIOLOGY DEPARTMENT WITHIN ONE WEEK IF SPECIATION AND SENSITIVITIES ARE REQUIRED.    Report Status 06/16/2016 FINAL  Final  Blood Culture ID Panel (Reflexed)     Status: Abnormal   Collection Time: 06/14/16  7:40 PM  Result Value Ref Range Status   Enterococcus species NOT DETECTED NOT DETECTED Final   Listeria monocytogenes NOT DETECTED NOT DETECTED Final   Staphylococcus species DETECTED (A) NOT DETECTED Final    Comment: Methicillin (oxacillin) resistant coagulase negative staphylococcus. Possible blood culture contaminant (unless isolated from more than one blood culture draw or clinical case suggests pathogenicity). No antibiotic treatment is indicated for blood  culture contaminants. CRITICAL RESULT CALLED TO, READ BACK BY AND VERIFIED WITH: T DANG PHARMD 1920 06/15/16 A BROWNING    Staphylococcus aureus NOT DETECTED NOT DETECTED Final   Methicillin resistance DETECTED (A) NOT DETECTED Final    Comment: CRITICAL RESULT CALLED TO, READ BACK BY AND VERIFIED WITH: T DANG PHARMD 1920 06/15/16 A BROWNING    Streptococcus species NOT DETECTED NOT DETECTED Final   Streptococcus agalactiae NOT DETECTED NOT DETECTED Final   Streptococcus pneumoniae NOT DETECTED NOT DETECTED Final   Streptococcus pyogenes NOT DETECTED NOT  DETECTED Final   Acinetobacter baumannii NOT DETECTED NOT DETECTED Final   Enterobacteriaceae species NOT DETECTED NOT DETECTED Final   Enterobacter cloacae complex NOT DETECTED NOT DETECTED Final   Escherichia coli NOT DETECTED NOT DETECTED Final   Klebsiella oxytoca NOT DETECTED NOT DETECTED Final   Klebsiella pneumoniae NOT DETECTED NOT DETECTED Final   Proteus species NOT DETECTED NOT DETECTED Final   Serratia marcescens NOT DETECTED NOT DETECTED Final   Haemophilus influenzae NOT DETECTED NOT DETECTED Final   Neisseria meningitidis NOT DETECTED NOT DETECTED Final   Pseudomonas aeruginosa NOT DETECTED NOT DETECTED Final   Candida albicans NOT DETECTED NOT DETECTED Final   Candida glabrata NOT DETECTED NOT DETECTED Final   Candida krusei NOT DETECTED NOT DETECTED Final   Candida parapsilosis NOT DETECTED NOT DETECTED Final   Candida tropicalis NOT DETECTED NOT DETECTED Final  Culture, blood (Routine X 2) w Reflex to ID Panel     Status: None (Preliminary result)   Collection Time: 06/14/16  7:50 PM  Result Value Ref Range Status   Specimen Description BLOOD RIGHT HAND  Final   Special Requests IN PEDIATRIC BOTTLE Blood Culture adequate volume  Final   Culture NO GROWTH 4 DAYS  Final   Report Status PENDING  Incomplete  Culture, respiratory (NON-Expectorated)     Status: None   Collection Time: 06/16/16 10:44 AM  Result Value Ref Range Status   Specimen Description TRACHEAL ASPIRATE  Final   Special Requests NONE  Final   Gram Stain   Final    RARE WBC PRESENT, PREDOMINANTLY MONONUCLEAR NO ORGANISMS SEEN    Culture Consistent with normal respiratory flora.  Final   Report Status 06/17/2016 FINAL  Final    Radiology Reports Ct Head Wo Contrast  Result Date: 06/14/2016 CLINICAL DATA:  Altered mental status EXAM: CT HEAD WITHOUT CONTRAST TECHNIQUE: Contiguous axial images were obtained from the base of the skull through the vertex without intravenous contrast. COMPARISON:  None.  FINDINGS: Brain: No mass lesion, intraparenchymal hemorrhage or extra-axial collection. No evidence of acute cortical infarct. There is periventricular hypoattenuation compatible with chronic microvascular disease. There is diffuse atrophy without lobar predominance. Vascular: Atherosclerotic calcification of the vertebral and internal carotid arteries at the skull base. Skull: Normal visualized skull base, calvarium and extracranial soft tissues. Sinuses/Orbits: No sinus fluid levels or advanced mucosal thickening. No mastoid effusion. Normal orbits. IMPRESSION: Chronic microvascular ischemia without acute intracranial abnormality. Electronically Signed   By: Ulyses Jarred M.D.   On: 06/14/2016 21:20   US Renal  Result Date: 06/16/2016 CLINICAL DATA:  Hematuria. EXAM: RENAL / URINARY TRACT ULTRASOUND COMPLETE COMPARISON:  No prior. FINDINGS: Right Kidney: Length: 14.9 cm. Echogenicity within normal limits. Prominent column of Bertin. If symptoms persist CT can be performed . No mass or hydronephrosis visualized. Left Kidney: Length: 14.7 cm. Echogenicity within normal limits. No mass or hydronephrosis visualized. Bladder: Foley catheter bladder.  Bladder decompressed. IMPRESSION: No acute or focal abnormality identified. No hydronephrosis. Bladder is decompressed by a Foley catheter. Electronically Signed   By: Marcello Moores  Register   On: 06/16/2016 06:16   Dg Chest Port 1 View  Result Date: 06/14/2016 CLINICAL DATA:  Pneumonia. EXAM: PORTABLE CHEST 1 VIEW COMPARISON:  None. FINDINGS: Patient is rotated to the left. Tracheostomy tube appears in adequate position. There is a right IJ central venous catheter with tip just below the cavoatrial junction. Lungs are adequately inflated with opacification over the left base which may be due to effusion and atelectasis although cannot exclude infection. Mild cardiomegaly. Calcified plaque is present over the aortic arch. There is minimal degenerate change of the spine.  IMPRESSION: Left base opacification which may be due to atelectasis and effusion versus infection. Follow-up PA and lateral chest x-ray would be helpful for further evaluation. Cardiomegaly. Aortic atherosclerosis. Tubes and lines as described. Electronically Signed   By: Marin Olp M.D.   On: 06/14/2016 20:08     [facility], Mithra Spano M.D on 06/19/2016 at 10:06 AM  Between 7am to 7pm - Pager - [phone]  After 7pm go to [email or web address] - password [email or web address]  [facility] -  Office  [phone]

## 2016-06-19 NOTE — Progress Notes (Signed)
Call To Dr Jamal Maes [phone] and rec'd conformation to start 24 hr Creatinine Urine and flush foley only when needed.

## 2016-06-20 DIAGNOSIS — E871 Hypo-osmolality and hyponatremia: Secondary | ICD-10-CM

## 2016-06-20 DIAGNOSIS — J9620 Acute and chronic respiratory failure, unspecified whether with hypoxia or hypercapnia: Secondary | ICD-10-CM

## 2016-06-20 DIAGNOSIS — J181 Lobar pneumonia, unspecified organism: Secondary | ICD-10-CM

## 2016-06-20 LAB — RENAL FUNCTION PANEL
Albumin: 1.6 g/dL — ABNORMAL LOW (ref 3.5–5.0)
Anion gap: 7 (ref 5–15)
BUN: 79 mg/dL — AB (ref 6–20)
CALCIUM: 6.9 mg/dL — AB (ref 8.9–10.3)
CO2: 30 mmol/L (ref 22–32)
CREATININE: 0.9 mg/dL (ref 0.61–1.24)
Chloride: 111 mmol/L (ref 101–111)
GFR calc non Af Amer: >60 mL/min (ref >60)
GLUCOSE: 170 mg/dL — AB (ref 65–99)
Phosphorus: 3 mg/dL (ref 2.5–4.6)
Potassium: 3.8 mmol/L (ref 3.5–5.1)
SODIUM: 148 mmol/L — AB (ref 135–145)

## 2016-06-20 LAB — GLUCOSE, CAPILLARY
GLUCOSE-CAPILLARY: 176 mg/dL — AB (ref 65–99)
GLUCOSE-CAPILLARY: 181 mg/dL — AB (ref 65–99)
GLUCOSE-CAPILLARY: 186 mg/dL — AB (ref 65–99)
GLUCOSE-CAPILLARY: 214 mg/dL — AB (ref 65–99)
GLUCOSE-CAPILLARY: 239 mg/dL — AB (ref 65–99)
GLUCOSE-CAPILLARY: 95 mg/dL (ref 65–99)
Glucose-Capillary: 170 mg/dL — ABNORMAL HIGH (ref 65–99)

## 2016-06-20 LAB — BLOOD GAS, ARTERIAL
Acid-Base Excess: 2.9 mmol/L — ABNORMAL HIGH (ref 0.0–2.0)
BICARBONATE: 29.4 mmol/L — AB (ref 20.0–28.0)
DRAWN BY: 236041
FIO2: 100
O2 SAT: 98.9 %
PATIENT TEMPERATURE: 98.6
PO2 ART: 168 mmHg — AB (ref 83.0–108.0)
pCO2 arterial: 67.6 mmHg (ref 32.0–48.0)
pH, Arterial: 7.26 — ABNORMAL LOW (ref 7.350–7.450)

## 2016-06-20 LAB — CBC
HCT: 26 % — ABNORMAL LOW (ref 39.0–52.0)
Hemoglobin: 8.1 g/dL — ABNORMAL LOW (ref 13.0–17.0)
MCH: 29.6 pg (ref 26.0–34.0)
MCHC: 31.2 g/dL (ref 30.0–36.0)
MCV: 94.9 fL (ref 78.0–100.0)
PLATELETS: 233 10*3/uL (ref 150–400)
RBC: 2.74 MIL/uL — AB (ref 4.22–5.81)
RDW: 18.2 % — AB (ref 11.5–15.5)
WBC: 16.3 10*3/uL — ABNORMAL HIGH (ref 4.0–10.5)

## 2016-06-20 LAB — IRON AND TIBC
IRON: 67 ug/dL (ref 45–182)
Saturation Ratios: 39 % (ref 17.9–39.5)
TIBC: 171 ug/dL — ABNORMAL LOW (ref 250–450)
UIBC: 104 ug/dL

## 2016-06-20 LAB — CREATININE CLEARANCE, URINE, 24 HOUR
COLLECTION INTERVAL-CRCL: 24 h
CREAT CLEAR: 47 mL/min — AB (ref 75–125)
CREATININE 24H UR: 613 mg/d — AB (ref 800–2000)
Creatinine, Urine: 20.78 mg/dL
Urine Total Volume-CRCL: 2950 mL

## 2016-06-20 MED ORDER — IPRATROPIUM-ALBUTEROL 0.5-2.5 (3) MG/3ML IN SOLN
3.0000 mL | Freq: Three times a day (TID) | RESPIRATORY_TRACT | Status: DC
  Administered 2016-06-20 – 2016-06-21 (×5): 3 mL via RESPIRATORY_TRACT
  Filled 2016-06-20 (×5): qty 3

## 2016-06-20 MED ORDER — DEXTROSE 5 % IV SOLN
INTRAVENOUS | Status: DC
  Administered 2016-06-20 (×2): via INTRAVENOUS

## 2016-06-20 MED ORDER — BACITRACIN ZINC 500 UNIT/GM EX OINT
TOPICAL_OINTMENT | Freq: Two times a day (BID) | CUTANEOUS | Status: DC
  Administered 2016-06-20: 1 via TOPICAL
  Administered 2016-06-20 – 2016-06-21 (×2): via TOPICAL
  Filled 2016-06-20: qty 28.35

## 2016-06-20 MED ORDER — FREE WATER
250.0000 mL | Freq: Four times a day (QID) | Status: DC
  Administered 2016-06-20 – 2016-06-21 (×6): 250 mL

## 2016-06-20 MED ORDER — SODIUM CHLORIDE 0.9 % IV BOLUS (SEPSIS)
500.0000 mL | Freq: Once | INTRAVENOUS | Status: AC
  Administered 2016-06-20: 500 mL via INTRAVENOUS

## 2016-06-20 NOTE — Progress Notes (Signed)
SLP Cancellation Note  Patient Details Name: Candy Ziegler MRN: [id number] DOB: 09-08-1945   Cancelled treatment:       Reason Eval/Treat Not Completed: Medical issues which prohibited therapy. On the ventilator.    Aliviya Schoeller, Katherene Ponto 06/20/2016, 12:13 PM

## 2016-06-20 NOTE — Progress Notes (Addendum)
eLink Physician-Brief Progress Note Patient Name: Bryan Frank DOB: Dec 11, 1945 MRN: [id number]   Date of Service  06/20/2016  HPI/Events of Note  Copious secretions, needed suctioning & bagging x2, now on 100% FIO2, was on 40% earlier On ATC since 6/7 New RLL infx on CXR 6/10  eICU Interventions  Obtain ABG & resume vent Ct empiric abx     Intervention Category Major Interventions: Respiratory failure - evaluation and management  Charlsie Fleeger V. 06/20/2016, 1:55 AM

## 2016-06-20 NOTE — Progress Notes (Signed)
PULMONARY / CRITICAL CARE MEDICINE   Name: Bryan Frank MRN: [id number] DOB: 09-Jul-1945    ADMISSION DATE:  06/14/2016 CONSULTATION DATE:  6/5  REFERRING MD:  [facility]  CHIEF COMPLAINT:  Renal failure  HISTORY OF PRESENT ILLNESS:   71 year old male with PMH as below, which is significant for DM2, HLD, GERD, HTN, and tobacco abuse. It appears as though he was admitted to [place], [place] hospital 04/2016 for severe COPD exacerbation and dense pneumonia. Also concern for UTI and treated with broad spectrum antibiotics. He improved and was transferred to [facility] in [place] 4/19. At that time he was on supplemental O2 during the day and BiPAP at night. On 4/21 his condition worsened requiring intubation. Likely due to severe COPD and possible PNA. He was unable to wean and had tracheostomy placed 4/27. He was found to have bilateral pleural effusions and is s/p pleurex catheter and thoracentesis. Cytology so far negative. He also required HD for worsening renal failure. This continued to worsen. Course also complicated by copious respiratory secretions. 6/5 he was transferred to [facility] at family request for further care and continued hemodialysis.   SUBJECTIVE / interval events:  He maintained off mechanical ventilation since 6/7, was placed back on this morning 6/11 with increased secretions and right basilar infiltrate versus atelectasis.   VITAL SIGNS: BP (!) 152/67 (BP Location: Right Arm)   Pulse 75   Temp 99.3 F (37.4 C) (Oral)   Resp 17   Ht 5\' 9"  (1.753 m)   Wt 73.6 kg (162 lb 4.1 oz)   SpO2 100%   BMI 23.96 kg/m   HEMODYNAMICS:    VENTILATOR SETTINGS: Vent Mode: PRVC FiO2 (%):  [30 %-50 %] 50 % Set Rate:  [14 bmp] 14 bmp Vt Set:  [500 mL] 500 mL PEEP:  [5 cmH20] 5 cmH20 Plateau Pressure:  [15 cmH20-22 cmH20] 18 cmH20  INTAKE / OUTPUT: I/O last 3 completed shifts: In: 5934.8 [I.V.:3285; Other:90; NG/GT:1809.8; IV Piggyback:750] Out: 6606  [Urine:5180]  PHYSICAL EXAMINATION: General:  Ill-appearing elderly man, comfortable in bed Neuro:  Awake, not in questions, globally weak, including weak cough HEENT:  Tracheostomy in place, looks clean dry and intact Cardiovascular:  Irregular, heart rate 100s Lungs: Coarse bilaterally Abdomen: Soft, benign, PEG in place Musculoskeletal:  No deformity Skin: Sacral decubitus ulcer dressed  LABS:  BMET  Recent Labs Lab 06/18/16 0438 06/19/16 0127 06/20/16 0342  NA 145 144 148*  K 3.7 3.9 3.8  CL 107 108 111  CO2 29 29 30   BUN 74* 82* 79*  CREATININE 0.97 1.06 0.90  GLUCOSE 180* 273* 170*    Electrolytes  Recent Labs Lab 06/16/16 1840 06/17/16 0629 06/18/16 0438 06/19/16 0127 06/20/16 0342  CALCIUM 7.6* 7.5* 7.2* 7.2* 6.9*  MG 1.7 1.6* 1.4*  --   --   PHOS 1.9* 2.0*  2.0* 2.6  2.6 2.7 3.0    CBC  Recent Labs Lab 06/18/16 0438 06/19/16 0739 06/20/16 0342  WBC 24.1* 28.6* 16.3*  HGB 6.9* 9.3* 8.1*  HCT 22.7* 27.2* 26.0*  PLT 318 276 233    Coag's  Recent Labs Lab 06/14/16 1956 06/15/16 0208  APTT  --  39*  INR 1.22  --     Sepsis Markers  Recent Labs Lab 06/14/16 1956 06/14/16 2150 06/15/16 0208 06/16/16 0122  LATICACIDVEN 0.8  --   --   --   PROCALCITON  --  1.59 0.93 0.71    ABG  Recent  Labs Lab 06/15/16 1545 06/16/16 1048 06/20/16 0155  PHART 7.425 7.424 7.260*  PCO2ART 41.7 44.2 67.6*  PO2ART 66.6* 62.1* 168*    Liver Enzymes  Recent Labs Lab 06/14/16 1956  06/18/16 0438 06/19/16 0127 06/20/16 0342  AST 101*  --   --   --   --   ALT 88*  --   --   --   --   ALKPHOS 302*  --   --   --   --   BILITOT 0.9  --   --   --   --   ALBUMIN 1.5*  < > 1.5* 1.7* 1.6*  < > = values in this interval not displayed.  Cardiac Enzymes No results for input(s): TROPONINI, PROBNP in the last 168 hours.  Glucose  Recent Labs Lab 06/19/16 2022 06/20/16 0035 06/20/16 0329 06/20/16 0739 06/20/16 1223 06/20/16 1615   GLUCAP 179* 214* 176* 95 239* 186*    Imaging No results found.   STUDIES:  Ct head 6/5 >>> chronic microvascular ischemic changes without acute finding  CULTURES: resp cx 5/31 >> Klebsiella (pansensitive), pseudomonas (pan resistant, intermediate to ceftazidime, sensitive to aminoglycoside); Corynebacterium  Respiratory aspirate 6/3>> Klebsiella (pansensitive), pseudomonas (pan resistant, intermediate to ceftazidime, sensitive to aminoglycoside); Corynebacterium  Tracheal aspirate 6/5 >> rare GNR, rare GPC, >>  Blood culture 6/5 >> 1 of 2 coag-negative staph, likely MRSE based on PCR panel Urine 6/5 >> multiple species, suggests recollection  ANTIBIOTICS: Cefepime 5/29 >6/5 Ceftaz 6/5 > Levaquin 6/5 > Vancomycin 6/5 > 6/7  SIGNIFICANT EVENTS: Admit to [facility] 4/19 [facility]  LINES/TUBES: Trach 4/27 > PEG 4/27 > Tunneled RIJ HD cath 6/5 >  DISCUSSION:   ASSESSMENT / PLAN:  PULMONARY A: Acute on chronic hypoxemic and hypercarbic respiratory failure. Severe COPD with +/- exacerbation Pulmonary edema  HCAP - cultures pseudomonas and corynebacterium (no sens done) Bilateral pleural effusions  P:   As expected he was able to stay off mechanical ventilation for several days but then returned to mechanical ventilation this morning. This was his pattern as they were working to wean him at [facility] that he will require nocturnal ventilation at least for an extended period of time. He may be at least partially vent dependent forever.  He should be out of move back to [facility] at any time to continue his ventilator weaning, treatment for his pneumonia. He is now hemodynamically stable and renal function has returned to normal. Follow chest x-ray for resolution atelectasis/infiltrate with reinitiation of mechanical ventilation Continue scheduled DuoNeb, scheduled Pulmicort neb  RENAL A:   Recurrent (required HD 4/21 - 06/01/16 at [facility]) acute renal  failure, serologies negative for glomerulonephritis, likely ATN Neurogenic bladder Hyponatremia  P:   He appears to have had a good renal recovery. Has not required CVVH since 6/7. Certainly he has labile as he has had recurrent failure before but hopefully this reflects a full recovery Appreciate nephrology assistance   INFECTIOUS A:   HCAP - cultures pseudomonas, klebsiella, and corynebacterium (no sens done). Had been worsening at [facility] despite treatment with cefepime Likely UTI, first culture multiple species  1 of 2 blood cultures MRSE, likely contaminant  Sacral decub PEG site erythema   P:   Agree with ceftaz, Levaquin Could consider initiation of tobramycin nebulizer but do not believe this is indicated for now Continue wound care  FAMILY  - Updates: No family present 6/11  - Inter-disciplinary family meet or Palliative Care meeting due  by:  6/12    Baltazar Apo, MD, PhD 06/20/2016, 5:06 PM [facility] [phone] or if no answer [phone]

## 2016-06-20 NOTE — Progress Notes (Signed)
PROGRESS NOTE                                                                                                                                                                                                             Patient Demographics:    Bryan Frank, is a 71 y.o. male, DOB - October 07, 1945, EXM:[id number]  Admit date - 06/14/2016   Admitting Physician Javier Glazier, MD  Outpatient Primary MD for the patient is No primary care provider on file.  LOS - 6   No chief complaint on file.      Brief Narrative   71 year old male with PMH as below, which is significant for DM2, HLD, GERD, HTN, and tobacco abuse. It appears as though he was admitted to [place] [place] [facility] 04/2016 for severe COPD exacerbation and dense pneumonia. Also concern for UTI and treated with broad spectrum antibiotics. He improved and was transferred to [facility] in [place] 4/19. At that time he was on supplemental O2 during the day and BiPAP at night. On 4/21 his condition worsened requiring intubation. Likely due to severe COPD and possible PNA. He was unable to wean and had tracheostomy placed 4/27. He was found to have bilateral pleural effusions and is s/p pleurex catheter and thoracentesis. Cytology so far negative. He also required HD for worsening renal failure. This continued to worsen. Course also complicated by copious respiratory secretions. 6/5 he was transferred to [facility] at family request for further care and continued hemodialysis.    Subjective:    [name] today Is non-verbal, cannot provide any complaints, no significant events overnight, received 2 units PRBC yesterday     Assessment  & Plan :    Active Problems:   ESRD (end stage renal disease) ([place])   Acute on chronic alteration in mental status   PNA (pneumonia)   Tracheostomy care ([place])   Pressure injury of skin  Vent dependent respiratory failure - In the setting of severe  COPD at baseline, worsening secondary to pulmonary edema and HCAP, and bilateral pleural effusion. - off vent vent for 96 hours, was on ATC 40%, unfortunately with respiratory decompensation requiring to go back on the vent earlier a.m., this is most likely due to right lung base pneumonia. Continue with chest PT, Mucinex and pulmonary toilet. - Discussed with PC  CM, they will see today regarding vent management.  COPD - No active wheezing, continue with scheduled DuoNeb's, and Pulmicort  HCAP - cultures pseudomonas, klebsiella, and corynebacterium (no sens done). Had been worsening at [facility] despite treatment with cefepime. - Discussed with PCCM, continue ceftaz and levofloxacin for total of 10 days - 6 related with evidence of a new right lung base opacity, will discuss with PCCM if need to add any new antibiotic coverage.  AKI - Management per renal, required dialysis at [facility], and CRRT upon presentation to [facility], creatinine remained stable off CRRT, management per renal  Hematuria - Secondary to traumatic Foley, appears to be improving, no further hematuria, but still passing small blood clots were flushed, so continue with Foley flushing every 4 hours.  MRSA bacteremia - 1/2 bottles, most likely contaminant  Hypertension - Blood pressure improved, continue with metoprolol  Paroxysmal A. Fib - Rate controlled on metoprolol,Hold off on anticoagulation until hematuria resolves   Acute encephalopathy -  etiology unclear but suspect multifactorial: Medications, sepsis, hypercapnia, progressive renal dysfunction  Hyponatremia - Sodium was 148 today, will change half-normal saline to D5W, and will increase free water via PEG  Anemia -  gross hematuria contributing, received 2 units PRBC on 6/10, hemoglobin improved from 6.9-9.3   Sacral pressure ulcer - Continue with wound care  Code Status : Full  Family Communication  : none at bedside  Disposition Plan  : pending  further work up.  Consults  :  PCCM, Renal  Procedures  : CRRT  DVT Prophylaxis  : Divide heparin  - SCDs   Lab Results  Component Value Date   PLT 233 06/20/2016    Antibiotics  :   Anti-infectives    Start     Dose/Rate Route Frequency Ordered Stop   06/19/16 2300  levofloxacin (LEVAQUIN) IVPB 500 mg  Status:  Discontinued     500 mg 100 mL/hr over 60 Minutes Intravenous Every 24 hours 06/19/16 0827 06/19/16 0829   06/19/16 2300  levofloxacin (LEVAQUIN) IVPB 500 mg     500 mg 100 mL/hr over 60 Minutes Intravenous Every 48 hours 06/19/16 0829     06/17/16 2300  levofloxacin (LEVAQUIN) IVPB 500 mg  Status:  Discontinued     500 mg 100 mL/hr over 60 Minutes Intravenous Every 48 hours 06/16/16 0824 06/19/16 0827   06/17/16 1345  cefTAZidime (FORTAZ) 2 g in dextrose 5 % 50 mL IVPB     2 g 100 mL/hr over 30 Minutes Intravenous Every 12 hours 06/17/16 1333     06/16/16 2200  cefTAZidime (FORTAZ) 1 g in dextrose 5 % 50 mL IVPB  Status:  Discontinued     1 g 100 mL/hr over 30 Minutes Intravenous Every 24 hours 06/16/16 0824 06/16/16 1409   06/16/16 2200  cefTAZidime (FORTAZ) 2 g in dextrose 5 % 50 mL IVPB  Status:  Discontinued     2 g 100 mL/hr over 30 Minutes Intravenous Every 24 hours 06/16/16 1409 06/17/16 1333   06/15/16 2200  Levofloxacin (LEVAQUIN) IVPB 250 mg  Status:  Discontinued     250 mg 50 mL/hr over 60 Minutes Intravenous Every 24 hours 06/14/16 2108 06/16/16 0824   06/14/16 2300  vancomycin (VANCOCIN) 1,250 mg in sodium chloride 0.9 % 250 mL IVPB     1,250 mg 166.7 mL/hr over 90 Minutes Intravenous  Once 06/14/16 2108 06/15/16 0030   06/14/16 2300  vancomycin (VANCOCIN) IVPB 750 mg/150 ml premix  Status:  Discontinued     750 mg 150 mL/hr over 60 Minutes Intravenous Every 24 hours 06/14/16 2108 06/16/16 0824   06/14/16 2200  levofloxacin (LEVAQUIN) IVPB 500 mg     500 mg 100 mL/hr over 60 Minutes Intravenous  Once 06/14/16 2108 06/14/16 2330   06/14/16 2200   cefTAZidime (FORTAZ) 2 g in dextrose 5 % 50 mL IVPB  Status:  Discontinued     2 g 100 mL/hr over 30 Minutes Intravenous Every 12 hours 06/14/16 2108 06/16/16 0824        Objective:   Vitals:   06/20/16 0744 06/20/16 0751 06/20/16 1202 06/20/16 1228  BP: 131/65 (!) 107/49 (!) 152/67 (!) 152/67  Pulse: 67 68 69   Resp: 17 16 (!) 24   Temp:  98.9 F (37.2 C)  97.7 F (36.5 C)  TempSrc:  Oral  Oral  SpO2: 100% 100% 100%   Weight:      Height:        Wt Readings from Last 3 Encounters:  06/20/16 73.6 kg (162 lb 4.1 oz)     Intake/Output Summary (Last 24 hours) at 06/20/16 1244 Last data filed at 06/20/16 1058  Gross per 24 hour  Intake          4895.66 ml  Output             3400 ml  Net          1495.66 ml     Physical Exam  Open eyes, follow simple commands Trach with no increased secretion, back on the vent Good air entry bilaterally, no wheezing, right base lung Rales Regular rate and rhythm, no rales murmurs gallops Abdomen soft, nontender, nondistended, bowel sounds present, some mild erythema around PEG  Site Extremities with no edema, clubbing or cyanosis, has significant muscle wasting, and delayed skin turgor   Data Review:    CBC  Recent Labs Lab 06/14/16 1956  06/16/16 1840 06/17/16 0629 06/18/16 0438 06/19/16 0739 06/20/16 0342  WBC 24.8*  < > 24.6* 22.6* 24.1* 28.6* 16.3*  HGB 7.3*  < > 7.6* 7.2* 6.9* 9.3* 8.1*  HCT 21.4*  < > 23.8* 23.3* 22.7* 27.2* 26.0*  PLT 274  < > 320 335 318 276 233  MCV 92.2  < > 91.5 93.2 95.0 99.3 94.9  MCH 31.5  < > 29.2 28.8 28.9 33.9 29.6  MCHC 34.1  < > 31.9 30.9 30.4 34.2 31.2  RDW 17.1*  < > 16.9* 17.0* 17.5* 19.0* 18.2*  LYMPHSABS 1.2  --   --   --   --   --   --   MONOABS 0.7  --   --   --   --   --   --   EOSABS 0.0  --   --   --   --   --   --   BASOSABS 0.2*  --   --   --   --   --   --   < > = values in this interval not displayed.  Chemistries   Recent Labs Lab 06/14/16 1956 06/15/16 0208   06/16/16 0122 06/16/16 1840 06/17/16 0629 06/18/16 0438 06/19/16 0127 06/20/16 0342  NA 133* 133*  < > 138 140 142 145 144 148*  K 4.4 4.0  < > 3.7 4.1 3.9 3.7 3.9 3.8  CL 94* 95*  < > 103 104 105 107 108 111  CO2 25 26  < > '27 28 28 29 ' 29  30  GLUCOSE 229* 247*  < > 181* 220* 252* 180* 273* 170*  BUN 160* 123*  < > 40* 48* 63* 74* 82* 79*  CREATININE 2.48* 1.93*  < > 0.73 0.83 0.98 0.97 1.06 0.90  CALCIUM 7.4* 7.6*  < > 7.8* 7.6* 7.5* 7.2* 7.2* 6.9*  MG 2.0 2.0  --  1.9 1.7 1.6* 1.4*  --   --   AST 101*  --   --   --   --   --   --   --   --   ALT 88*  --   --   --   --   --   --   --   --   ALKPHOS 302*  --   --   --   --   --   --   --   --   BILITOT 0.9  --   --   --   --   --   --   --   --   < > = values in this interval not displayed. ------------------------------------------------------------------------------------------------------------------ No results for input(s): CHOL, HDL, LDLCALC, TRIG, CHOLHDL, LDLDIRECT in the last 72 hours.  No results found for: HGBA1C ------------------------------------------------------------------------------------------------------------------ No results for input(s): TSH, T4TOTAL, T3FREE, THYROIDAB in the last 72 hours.  Invalid input(s): FREET3 ------------------------------------------------------------------------------------------------------------------  Recent Labs  06/20/16 0846  TIBC 171*  IRON 67    Coagulation profile  Recent Labs Lab 06/14/16 1956  INR 1.22    No results for input(s): DDIMER in the last 72 hours.  Cardiac Enzymes No results for input(s): CKMB, TROPONINI, MYOGLOBIN in the last 168 hours.  Invalid input(s): CK ------------------------------------------------------------------------------------------------------------------ No results found for: BNP  Inpatient Medications  Scheduled Meds: . budesonide (PULMICORT) nebulizer solution  0.5 mg Nebulization BID  . chlorhexidine gluconate  (MEDLINE KIT)  15 mL Mouth Rinse BID  . Chlorhexidine Gluconate Cloth  6 each Topical Daily  . collagenase   Topical Daily  . darbepoetin (ARANESP) injection - NON-DIALYSIS  200 mcg Subcutaneous Q Wed-1800  . feeding supplement (PRO-STAT SUGAR FREE 64)  30 mL Per Tube BID  . free water  250 mL Per Tube Q6H  . heparin subcutaneous  5,000 Units Subcutaneous Q8H  . insulin aspart  0-15 Units Subcutaneous Q4H  . insulin glargine  10 Units Subcutaneous Daily  . ipratropium-albuterol  3 mL Nebulization TID  . mouth rinse  15 mL Mouth Rinse QID  . metoprolol tartrate  100 mg Per Tube BID  . midazolam  2 mg Intravenous Once  . pantoprazole (PROTONIX) IV  40 mg Intravenous QHS   Continuous Infusions: . sodium chloride Stopped (06/18/16 0100)  . cefTAZidime (FORTAZ)  IV Stopped (06/20/16 1130)  . dextrose 75 mL/hr at 06/20/16 0738  . feeding supplement (VITAL AF 1.2 CAL) 1,000 mL (06/19/16 2239)  . levofloxacin (LEVAQUIN) IV Stopped (06/20/16 0021)   PRN Meds:.sodium chloride, metoprolol tartrate  Micro Results Recent Results (from the past 240 hour(s))  MRSA PCR Screening     Status: None   Collection Time: 06/14/16  7:00 PM  Result Value Ref Range Status   MRSA by PCR NEGATIVE NEGATIVE Final    Comment:        The GeneXpert MRSA Assay (FDA approved for NASAL specimens only), is one component of a comprehensive MRSA colonization surveillance program. It is not intended to diagnose MRSA infection nor to guide or monitor treatment for MRSA infections.   Culture, Urine  Status: Abnormal   Collection Time: 06/14/16  7:34 PM  Result Value Ref Range Status   Specimen Description URINE, RANDOM  Final   Special Requests NONE  Final   Culture MULTIPLE SPECIES PRESENT, SUGGEST RECOLLECTION (A)  Final   Report Status 06/16/2016 FINAL  Final  Culture, respiratory (NON-Expectorated)     Status: None   Collection Time: 06/14/16  7:34 PM  Result Value Ref Range Status   Specimen  Description TRACHEAL ASPIRATE  Final   Special Requests NONE  Final   Gram Stain   Final    MODERATE WBC PRESENT, PREDOMINANTLY PMN FEW SQUAMOUS EPITHELIAL CELLS PRESENT FEW GRAM POSITIVE RODS RARE GRAM NEGATIVE RODS RARE GRAM POSITIVE COCCI IN PAIRS    Culture Consistent with normal respiratory flora.  Final   Report Status 06/16/2016 FINAL  Final  Culture, blood (Routine X 2) w Reflex to ID Panel     Status: Abnormal   Collection Time: 06/14/16  7:40 PM  Result Value Ref Range Status   Specimen Description BLOOD RIGHT ANTECUBITAL  Final   Special Requests IN PEDIATRIC BOTTLE Blood Culture adequate volume  Final   Culture  Setup Time   Final    GRAM POSITIVE COCCI IN PEDIATRIC BOTTLE CRITICAL RESULT CALLED TO, READ BACK BY AND VERIFIED WITH: T DANG PHARMD 1920 06/15/16 A BROWNING    Culture (A)  Final    STAPHYLOCOCCUS SPECIES (COAGULASE NEGATIVE) THE SIGNIFICANCE OF ISOLATING THIS ORGANISM FROM A SINGLE SET OF BLOOD CULTURES WHEN MULTIPLE SETS ARE DRAWN IS UNCERTAIN. PLEASE NOTIFY THE MICROBIOLOGY DEPARTMENT WITHIN ONE WEEK IF SPECIATION AND SENSITIVITIES ARE REQUIRED.    Report Status 06/16/2016 FINAL  Final  Blood Culture ID Panel (Reflexed)     Status: Abnormal   Collection Time: 06/14/16  7:40 PM  Result Value Ref Range Status   Enterococcus species NOT DETECTED NOT DETECTED Final   Listeria monocytogenes NOT DETECTED NOT DETECTED Final   Staphylococcus species DETECTED (A) NOT DETECTED Final    Comment: Methicillin (oxacillin) resistant coagulase negative staphylococcus. Possible blood culture contaminant (unless isolated from more than one blood culture draw or clinical case suggests pathogenicity). No antibiotic treatment is indicated for blood  culture contaminants. CRITICAL RESULT CALLED TO, READ BACK BY AND VERIFIED WITH: T DANG PHARMD 1920 06/15/16 A BROWNING    Staphylococcus aureus NOT DETECTED NOT DETECTED Final   Methicillin resistance DETECTED (A) NOT DETECTED  Final    Comment: CRITICAL RESULT CALLED TO, READ BACK BY AND VERIFIED WITH: T DANG PHARMD 1920 06/15/16 A BROWNING    Streptococcus species NOT DETECTED NOT DETECTED Final   Streptococcus agalactiae NOT DETECTED NOT DETECTED Final   Streptococcus pneumoniae NOT DETECTED NOT DETECTED Final   Streptococcus pyogenes NOT DETECTED NOT DETECTED Final   Acinetobacter baumannii NOT DETECTED NOT DETECTED Final   Enterobacteriaceae species NOT DETECTED NOT DETECTED Final   Enterobacter cloacae complex NOT DETECTED NOT DETECTED Final   Escherichia coli NOT DETECTED NOT DETECTED Final   Klebsiella oxytoca NOT DETECTED NOT DETECTED Final   Klebsiella pneumoniae NOT DETECTED NOT DETECTED Final   Proteus species NOT DETECTED NOT DETECTED Final   Serratia marcescens NOT DETECTED NOT DETECTED Final   Haemophilus influenzae NOT DETECTED NOT DETECTED Final   Neisseria meningitidis NOT DETECTED NOT DETECTED Final   Pseudomonas aeruginosa NOT DETECTED NOT DETECTED Final   Candida albicans NOT DETECTED NOT DETECTED Final   Candida glabrata NOT DETECTED NOT DETECTED Final   Candida krusei NOT DETECTED NOT DETECTED  Final   Candida parapsilosis NOT DETECTED NOT DETECTED Final   Candida tropicalis NOT DETECTED NOT DETECTED Final  Culture, blood (Routine X 2) w Reflex to ID Panel     Status: None   Collection Time: 06/14/16  7:50 PM  Result Value Ref Range Status   Specimen Description BLOOD RIGHT HAND  Final   Special Requests IN PEDIATRIC BOTTLE Blood Culture adequate volume  Final   Culture NO GROWTH 5 DAYS  Final   Report Status 06/19/2016 FINAL  Final  Culture, respiratory (NON-Expectorated)     Status: None   Collection Time: 06/16/16 10:44 AM  Result Value Ref Range Status   Specimen Description TRACHEAL ASPIRATE  Final   Special Requests NONE  Final   Gram Stain   Final    RARE WBC PRESENT, PREDOMINANTLY MONONUCLEAR NO ORGANISMS SEEN    Culture Consistent with normal respiratory flora.  Final    Report Status 06/17/2016 FINAL  Final    Radiology Reports Ct Head Wo Contrast  Result Date: 06/14/2016 CLINICAL DATA:  Altered mental status EXAM: CT HEAD WITHOUT CONTRAST TECHNIQUE: Contiguous axial images were obtained from the base of the skull through the vertex without intravenous contrast. COMPARISON:  None. FINDINGS: Brain: No mass lesion, intraparenchymal hemorrhage or extra-axial collection. No evidence of acute cortical infarct. There is periventricular hypoattenuation compatible with chronic microvascular disease. There is diffuse atrophy without lobar predominance. Vascular: Atherosclerotic calcification of the vertebral and internal carotid arteries at the skull base. Skull: Normal visualized skull base, calvarium and extracranial soft tissues. Sinuses/Orbits: No sinus fluid levels or advanced mucosal thickening. No mastoid effusion. Normal orbits. IMPRESSION: Chronic microvascular ischemia without acute intracranial abnormality. Electronically Signed   By: Ulyses Jarred M.D.   On: 06/14/2016 21:20   US Renal  Result Date: 06/16/2016 CLINICAL DATA:  Hematuria. EXAM: RENAL / URINARY TRACT ULTRASOUND COMPLETE COMPARISON:  No prior. FINDINGS: Right Kidney: Length: 14.9 cm. Echogenicity within normal limits. Prominent column of Bertin. If symptoms persist CT can be performed . No mass or hydronephrosis visualized. Left Kidney: Length: 14.7 cm. Echogenicity within normal limits. No mass or hydronephrosis visualized. Bladder: Foley catheter bladder.  Bladder decompressed. IMPRESSION: No acute or focal abnormality identified. No hydronephrosis. Bladder is decompressed by a Foley catheter. Electronically Signed   By: Marcello Moores  Register   On: 06/16/2016 06:16   Dg Chest Port 1 View  Result Date: 06/19/2016 CLINICAL DATA:  Leukocytosis. EXAM: PORTABLE CHEST 1 VIEW COMPARISON:  06/14/2016 chest radiograph FINDINGS: New right lower lung opacity noted -question atelectasis versus airspace disease.  Improved aeration in the left lower lung noted. Tracheostomy tube and right IJ central venous catheter with tip overlying the superior cavoatrial junction again noted. Cardiomegaly is identified. There is no evidence of pneumothorax or pleural effusion. IMPRESSION: New right lower lung opacity -atelectasis versus airspace disease/ pneumonia. Improved left lower lung aeration. Electronically Signed   By: Margarette Canada M.D.   On: 06/19/2016 10:49   Dg Chest Port 1 View  Result Date: 06/14/2016 CLINICAL DATA:  Pneumonia. EXAM: PORTABLE CHEST 1 VIEW COMPARISON:  None. FINDINGS: Patient is rotated to the left. Tracheostomy tube appears in adequate position. There is a right IJ central venous catheter with tip just below the cavoatrial junction. Lungs are adequately inflated with opacification over the left base which may be due to effusion and atelectasis although cannot exclude infection. Mild cardiomegaly. Calcified plaque is present over the aortic arch. There is minimal degenerate change of the spine. IMPRESSION: Left  base opacification which may be due to atelectasis and effusion versus infection. Follow-up PA and lateral chest x-ray would be helpful for further evaluation. Cardiomegaly. Aortic atherosclerosis. Tubes and lines as described. Electronically Signed   By: Marin Olp M.D.   On: 06/14/2016 20:08     [facility], Kerianne Gurr M.D on 06/20/2016 at 12:44 PM  Between 7am to 7pm - Pager - [phone]  After 7pm go to [email or web address] - password [facility]  [facility] -  Office  [phone]

## 2016-06-20 NOTE — Progress Notes (Signed)
Subjective: Interval History: on vent, not coherent, awake.  Objective: Vital signs in last 24 hours: Temp:  [97.1 F (36.2 C)-99 F (37.2 C)] 98.9 F (37.2 C) (06/11 0751) Pulse Rate:  [63-92] 68 (06/11 0751) Resp:  [14-27] 16 (06/11 0751) BP: (89-150)/(38-72) 107/49 (06/11 0751) SpO2:  [88 %-100 %] 100 % (06/11 0751) FiO2 (%):  [30 %-50 %] 50 % (06/11 0744) Weight:  [73.6 kg (162 lb 4.1 oz)] 73.6 kg (162 lb 4.1 oz) (06/11 0321) Weight change: 1.1 kg (2 lb 6.8 oz)  Intake/Output from previous day: 06/10 0701 - 06/11 0700 In: 4224.8 [I.V.:2185; NG/GT:1259.8; IV Piggyback:750] Out: 3100 [Urine:3100] Intake/Output this shift: Total I/O In: 93.3 [I.V.:63.3; Other:30] Out: 700 [Urine:700]  General appearance: no distress, mildly obese and pale Neck: trach Resp: Trach Cardio: S1, S2 normal and systolic murmur: holosystolic 2/6, blowing at apex GI: PEG, obese, pos bs, soft Extremities: edema 1-2+ presacral  Resp rales R lung  Lab Results:  Recent Labs  06/19/16 0739 06/20/16 0342  WBC 28.6* 16.3*  HGB 9.3* 8.1*  HCT 27.2* 26.0*  PLT 276 233   BMET:  Recent Labs  06/19/16 0127 06/20/16 0342  NA 144 148*  K 3.9 3.8  CL 108 111  CO2 29 30  GLUCOSE 273* 170*  BUN 82* 79*  CREATININE 1.06 0.90  CALCIUM 7.2* 6.9*   No results for input(s): PTH in the last 72 hours. Iron Studies: No results for input(s): IRON, TIBC, TRANSFERRIN, FERRITIN in the last 72 hours.  Studies/Results: Dg Chest Port 1 View  Result Date: 06/19/2016 CLINICAL DATA:  Leukocytosis. EXAM: PORTABLE CHEST 1 VIEW COMPARISON:  06/14/2016 chest radiograph FINDINGS: New right lower lung opacity noted -question atelectasis versus airspace disease. Improved aeration in the left lower lung noted. Tracheostomy tube and right IJ central venous catheter with tip overlying the superior cavoatrial junction again noted. Cardiomegaly is identified. There is no evidence of pneumothorax or pleural effusion.  IMPRESSION: New right lower lung opacity -atelectasis versus airspace disease/ pneumonia. Improved left lower lung aeration. Electronically Signed   By: Margarette Canada M.D.   On: 06/19/2016 10:49    I have reviewed the patient's current medications.  Assessment/Plan: 1 AKI x 2 in setting of infx, low bps. Now recovering.  Nonoliguric.  Suspect relatively low GFR 2 ^ SNa multiple causes ^ Glu with diuresis, osm load with TF, and recovering AKI will add free water with TF. On D5. 3 DM 4 VDRF per CCM 5 Pneu Pseudo on AB 6 Nutrition   ^ BUN with protein load P Free water, will F/u 24h urine,  Will follow 1-2 d    LOS: 6 days   Areyanna Figeroa L 06/20/2016,8:19 AM

## 2016-06-20 NOTE — Care Management Note (Addendum)
Case Management Note  Patient Details  Name: Bryan Frank MRN: [id number] Date of Birth: May 08, 1945  Subjective/Objective:   From [facility](therer for approximately 30 days).  Per liason - facility was having goals of care discussions with pt and family- requiesting palliative consult while admitted at [facility].  Facility will accept patient back when medically stable.  Has medicare insurance.   6/12 [place], BSN - NCM spoke with daughter Bryan Frank, and asked if she wanted patient to go back to [facility] today, per MD the patient is ready to be moved to next level of care.  Bryan Frank states that she does not want patient to go to [facility].  NCM informed her that we have a LTACH here in the hospital ([facility]) they state they can take patient today would she like for patient to go to [facility], she states yes.  NCM made referral to [facility] and they do have a bed today.  MD will do discharge to [facility].  NCM informed RN also.                    Action/Plan: NCM will follow for dc needs.   Expected Discharge Date:                  Expected Discharge Plan:  Long Term Acute Care (LTAC)  In-House Referral:     Discharge planning Services  CM Consult  Post Acute Care Choice:    Choice offered to:     DME Arranged:    DME Agency:     HH Arranged:    HH Agency:     Status of Service:  In process, will continue to follow  If discussed at Long Length of Stay Meetings, dates discussed:    Additional Comments:  Zenon Mayo, RN 06/20/2016, 4:45 PM

## 2016-06-20 NOTE — Progress Notes (Signed)
Pharmacy Antibiotic Note  Bryan Frank is a 71 y.o. male transferred to [facility] on 06/14/2016 due to acute renal failure requiring CRRT. Pharmacy has been consulted for Ceftazidime + Levaquin dosing.   Plan:  - Continue Ceftazidime 2g IV every 12 hours - Continue Levaquin 500 mg IV every 48 hours - Will continue to follow renal function, culture results, LOT, and antibiotic de-escalation plans   Height: 5\' 9"  (175.3 cm) Weight: 162 lb 4.1 oz (73.6 kg) IBW/kg (Calculated) : 70.7  Temp (24hrs), Avg:98.3 F (36.8 C), Min:97.1 F (36.2 C), Max:99 F (37.2 C)   Recent Labs Lab 06/14/16 1956  06/16/16 1840 06/17/16 0629 06/18/16 0438 06/19/16 0127 06/19/16 0739 06/20/16 0342  WBC 24.8*  < > 24.6* 22.6* 24.1*  --  28.6* 16.3*  CREATININE 2.48*  < > 0.83 0.98 0.97 1.06  --  0.90  LATICACIDVEN 0.8  --   --   --   --   --   --   --   < > = values in this interval not displayed.  Estimated Creatinine Clearance: 76.4 mL/min (by C-G formula based on SCr of 0.9 mg/dL).    No Known Allergies  Antimicrobials this admission: Vanc 6/5 >> 6/7 [name] 6/5>> Levaquin 6/5>>  Microbiology results: 5/31 Respiratory cx: Carbapenemase resistant pseudomonas - I to fortaz, S to amikacin                                   Corynebacterium: no sensitivities                                   Kleb peumo: Pan sensitive with I sensitivities to ampicillin  6/7 Trach asp: negative 6/5 BCx: 1/2 MRSE 6/5 resp cx: normal flora 6/5 UC: multiple species, recollect  Thank you for allowing pharmacy to be a part of this patient's care.  [name], PharmD, BCPS Clinical Pharmacist Pager: [phone] Clinical phone for 06/20/2016 from 7a-3:30p: [phone] If after 3:30p, please call main pharmacy at: [phone] 06/20/2016 11:47 AM

## 2016-06-21 ENCOUNTER — Inpatient Hospital Stay (HOSPITAL_COMMUNITY): Payer: Medicare Other

## 2016-06-21 ENCOUNTER — Other Ambulatory Visit (HOSPITAL_COMMUNITY): Payer: Self-pay

## 2016-06-21 ENCOUNTER — Encounter (HOSPITAL_COMMUNITY): Payer: Self-pay | Admitting: Interventional Radiology

## 2016-06-21 ENCOUNTER — Inpatient Hospital Stay
Admission: AD | Admit: 2016-06-21 | Discharge: 2016-07-14 | Disposition: A | Discharge status: Skilled Nursing Facility | Payer: Self-pay | Source: Ambulatory Visit | Attending: Internal Medicine | Admitting: Internal Medicine

## 2016-06-21 DIAGNOSIS — T85528A Displacement of other gastrointestinal prosthetic devices, implants and grafts, initial encounter: Secondary | ICD-10-CM

## 2016-06-21 DIAGNOSIS — Z560 Unemployment, unspecified: Secondary | ICD-10-CM

## 2016-06-21 DIAGNOSIS — W19XXXA Unspecified fall, initial encounter: Secondary | ICD-10-CM

## 2016-06-21 DIAGNOSIS — Z4659 Encounter for fitting and adjustment of other gastrointestinal appliance and device: Secondary | ICD-10-CM

## 2016-06-21 DIAGNOSIS — Z431 Encounter for attention to gastrostomy: Secondary | ICD-10-CM

## 2016-06-21 HISTORY — PX: IR REMOVAL TUN CV CATH W/O FL: IMG2289

## 2016-06-21 LAB — RENAL FUNCTION PANEL
ALBUMIN: 1.7 g/dL — AB (ref 3.5–5.0)
Anion gap: 8 (ref 5–15)
BUN: 67 mg/dL — ABNORMAL HIGH (ref 6–20)
CALCIUM: 7 mg/dL — AB (ref 8.9–10.3)
CO2: 31 mmol/L (ref 22–32)
CREATININE: 0.83 mg/dL (ref 0.61–1.24)
Chloride: 108 mmol/L (ref 101–111)
Glucose, Bld: 206 mg/dL — ABNORMAL HIGH (ref 65–99)
PHOSPHORUS: 2.3 mg/dL — AB (ref 2.5–4.6)
Potassium: 3.5 mmol/L (ref 3.5–5.1)
Sodium: 147 mmol/L — ABNORMAL HIGH (ref 135–145)

## 2016-06-21 LAB — GLUCOSE, CAPILLARY
GLUCOSE-CAPILLARY: 167 mg/dL — AB (ref 65–99)
GLUCOSE-CAPILLARY: 170 mg/dL — AB (ref 65–99)
GLUCOSE-CAPILLARY: 194 mg/dL — AB (ref 65–99)
GLUCOSE-CAPILLARY: 196 mg/dL — AB (ref 65–99)
Glucose-Capillary: 229 mg/dL — ABNORMAL HIGH (ref 65–99)

## 2016-06-21 LAB — URINALYSIS, ROUTINE W REFLEX MICROSCOPIC
BILIRUBIN URINE: NEGATIVE
Glucose, UA: NEGATIVE mg/dL
KETONES UR: NEGATIVE mg/dL
NITRITE: NEGATIVE
Protein, ur: 100 mg/dL — AB
SPECIFIC GRAVITY, URINE: 1.011 (ref 1.005–1.030)
Squamous Epithelial / LPF: NONE SEEN
pH: 6 (ref 5.0–8.0)

## 2016-06-21 LAB — PARATHYROID HORMONE, INTACT (NO CA): PTH: 84 pg/mL — AB (ref 15–65)

## 2016-06-21 MED ORDER — ALBUTEROL SULFATE (2.5 MG/3ML) 0.083% IN NEBU: 2.5000 mg | INHALATION_SOLUTION | RESPIRATORY_TRACT | Status: DC | PRN

## 2016-06-21 MED ORDER — IOPAMIDOL (ISOVUE-300) INJECTION 61%
INTRAVENOUS | Status: AC
  Filled 2016-06-21: qty 50

## 2016-06-21 MED ORDER — HEPARIN SODIUM (PORCINE) 5000 UNIT/ML IJ SOLN: 5000.0000 [IU] | Freq: Three times a day (TID) | INTRAMUSCULAR | Status: AC

## 2016-06-21 MED ORDER — LIDOCAINE HCL 1 % IJ SOLN
INTRAMUSCULAR | Status: AC
  Filled 2016-06-21: qty 20

## 2016-06-21 MED ORDER — VITAL AF 1.2 CAL PO LIQD: 1000.0000 mL | ORAL | Status: AC

## 2016-06-21 MED ORDER — LEVOFLOXACIN IN D5W 500 MG/100ML IV SOLN: 500.0000 mg | INTRAVENOUS | Status: AC

## 2016-06-21 MED ORDER — BACITRACIN ZINC 500 UNIT/GM EX OINT: TOPICAL_OINTMENT | Freq: Two times a day (BID) | CUTANEOUS | 0 refills | Status: AC

## 2016-06-21 MED ORDER — DEXTROSE 5 % IV SOLN: 25.0000 mL | INTRAVENOUS | Status: AC

## 2016-06-21 MED ORDER — INSULIN ASPART 100 UNIT/ML ~~LOC~~ SOLN: 0.0000 [IU] | SUBCUTANEOUS | 11 refills | Status: AC

## 2016-06-21 MED ORDER — LEVOFLOXACIN IN D5W 750 MG/150ML IV SOLN
750.0000 mg | INTRAVENOUS | Status: DC
  Administered 2016-06-21: 750 mg via INTRAVENOUS
  Filled 2016-06-21: qty 150

## 2016-06-21 MED ORDER — CHLORHEXIDINE GLUCONATE 0.12% ORAL RINSE (MEDLINE KIT): 15.0000 mL | Freq: Two times a day (BID) | OROMUCOSAL | 0 refills | Status: AC

## 2016-06-21 MED ORDER — BUDESONIDE 0.5 MG/2ML IN SUSP: 0.5000 mg | Freq: Two times a day (BID) | RESPIRATORY_TRACT | 12 refills | Status: AC

## 2016-06-21 MED ORDER — ATORVASTATIN CALCIUM 10 MG PO TABS: 10.0000 mg | ORAL_TABLET | Freq: Every day | ORAL | Status: AC

## 2016-06-21 MED ORDER — PRO-STAT SUGAR FREE PO LIQD: 30.0000 mL | Freq: Two times a day (BID) | ORAL | 0 refills | Status: AC

## 2016-06-21 MED ORDER — CHLORHEXIDINE GLUCONATE 4 % EX LIQD
CUTANEOUS | Status: AC
  Filled 2016-06-21: qty 15

## 2016-06-21 MED ORDER — INSULIN GLARGINE 100 UNIT/ML ~~LOC~~ SOLN: 10.0000 [IU] | Freq: Every day | SUBCUTANEOUS | 11 refills | Status: AC

## 2016-06-21 MED ORDER — FREE WATER: 250.0000 mL | Freq: Four times a day (QID) | Status: AC

## 2016-06-21 MED ORDER — IOPAMIDOL (ISOVUE-300) INJECTION 61%
INTRAVENOUS | Status: AC
  Administered 2016-06-22: 50 mL via GASTROSTOMY
  Filled 2016-06-21: qty 50

## 2016-06-21 MED ORDER — METOPROLOL TARTRATE 50 MG PO TABS: 100.0000 mg | ORAL_TABLET | Freq: Two times a day (BID) | ORAL | Status: AC

## 2016-06-21 MED ORDER — DARBEPOETIN ALFA 200 MCG/0.4ML IJ SOSY: 200.0000 ug | PREFILLED_SYRINGE | INTRAMUSCULAR | Status: AC

## 2016-06-21 MED ORDER — COLLAGENASE 250 UNIT/GM EX OINT: TOPICAL_OINTMENT | Freq: Every day | CUTANEOUS | 0 refills | Status: AC

## 2016-06-21 MED ORDER — DEXTROSE 5 % IV SOLN: 2.0000 g | Freq: Two times a day (BID) | INTRAVENOUS | Status: AC

## 2016-06-21 NOTE — Procedures (Signed)
Interventional Radiology Procedure Note  Procedure: Bedside removal of right IJ HD catheter.  Catheter removed in its entirety.  Complications: None Recommendations:  -  - Routine wound care   Signed,  Dulcy Fanny. Earleen Newport, DO

## 2016-06-21 NOTE — Progress Notes (Signed)
Subjective: Interval History:opens eyes and some coop.  On vent via trach.  Objective: Vital signs in last 24 hours: Temp:  [97.7 F (36.5 C)-99.3 F (37.4 C)] 98.8 F (37.1 C) (06/12 0729) Pulse Rate:  [64-97] 69 (06/12 0700) Resp:  [15-24] 21 (06/12 0700) BP: (122-152)/(45-67) 134/58 (06/12 0700) SpO2:  [99 %-100 %] 100 % (06/12 0700) FiO2 (%):  [40 %-50 %] 50 % (06/12 0441) Weight:  [74.4 kg (164 lb)] 74.4 kg (164 lb) (06/12 0422) Weight change: 0.79 kg (1 lb 11.9 oz)  Intake/Output from previous day: 06/11 0701 - 06/12 0700 In: 4095.8 [I.V.:1815.8; NG/GT:2200; IV Piggyback:50] Out: 3050 [Urine:3050] Intake/Output this shift: Total I/O In: 250 [NG/GT:250] Out: 300 [Urine:300]  General appearance: no distress, moderately obese, pale and slowed mentation Neck: Trach Resp: rhonchi bibasilar Chest wall: RIJ cath Cardio: S1, S2 normal and systolic murmur: systolic ejection 2/6, decrescendo at 2nd left intercostal space GI: PEG , pos bs, soft Extremities: edema 1+  Lab Results:  Recent Labs  06/19/16 0739 06/20/16 0342  WBC 28.6* 16.3*  HGB 9.3* 8.1*  HCT 27.2* 26.0*  PLT 276 233   BMET:  Recent Labs  06/20/16 0342 06/21/16 0424  NA 148* 147*  K 3.8 3.5  CL 111 108  CO2 30 31  GLUCOSE 170* 206*  BUN 79* 67*  CREATININE 0.90 0.83  CALCIUM 6.9* 7.0*    Recent Labs  06/20/16 0846  PTH 84*   Iron Studies:  Recent Labs  06/20/16 0846  IRON 67  TIBC 171*    Studies/Results: Dg Chest Port 1 View  Result Date: 06/19/2016 CLINICAL DATA:  Leukocytosis. EXAM: PORTABLE CHEST 1 VIEW COMPARISON:  06/14/2016 chest radiograph FINDINGS: New right lower lung opacity noted -question atelectasis versus airspace disease. Improved aeration in the left lower lung noted. Tracheostomy tube and right IJ central venous catheter with tip overlying the superior cavoatrial junction again noted. Cardiomegaly is identified. There is no evidence of pneumothorax or pleural  effusion. IMPRESSION: New right lower lung opacity -atelectasis versus airspace disease/ pneumonia. Improved left lower lung aeration. Electronically Signed   By: Margarette Canada M.D.   On: 06/19/2016 10:49    I have reviewed the patient's current medications.  Assessment/Plan: 1 AKI resolved. Has CKD 3 . Cr not representative due to low Ms mass.  No indic for dialysis or intervention so will d/c HD cath. 2 ^ SNa improving. Lower ivf, cont free water with TF 3 VDRF per CCM 4 Recurrent sepsis 5 anemia 6 malnutrition on TF P pull TDC, TF free water, will s/o at this time    LOS: 7 days   Bryan Frank L 06/21/2016,8:03 AM

## 2016-06-21 NOTE — Progress Notes (Signed)
  Speech Language Pathology Treatment: Bryan Frank Speaking valve  Patient Details Name: Bryan Frank MRN: [id number] DOB: Sep 24, 1945 Today's Date: 06/21/2016 Time: 9702-6378 SLP Time Calculation (min) (ACUTE ONLY): 25 min  Assessment / Plan / Recommendation Clinical Impression  Pt tolerated PMSV trials for 17 minutes without any signs of intolerance or change in vitals. Pt was poorly attentive, needed max cues for focused attention to therapist or functional task. Despite max verbal cues and opportunities for functional communication pt only phonated 3x during session. Pt spontaneously responded "yeah" to a question, sustained /a/ x1 and cleared throat on command x1. Pt may wear PMSV with full staff supervision. Will f/u to advance functional communication goals.   HPI HPI: 71 year old male with PMH: pna, DM, HTN, GERD and tobacco abuse. Per chart recent admit to [place], [place] hospital 04/2016 for severe COPD exacerbation and dense pneumonia, transferred to [facility]. On 4/21 condition worsened requiring intubation likely due to severe COPD and possible PNA; tracheostomy and PEG placed 4/27.      SLP Plan  Continue with current plan of care       Recommendations         Patient may use Passy-Muir Speech Valve: Intermittently with supervision;During all therapies with supervision;During PO intake/meals PMSV Supervision: Full         Oral Care Recommendations: Oral care QID Follow up Recommendations: Skilled Nursing facility Plan: Continue with current plan of care       GO               [facility], MA CCC-SLP [phone]  Lynann Beaver 06/21/2016, 10:13 AM

## 2016-06-21 NOTE — Progress Notes (Signed)
Inpatient Diabetes Program Recommendations  AACE/ADA: New Consensus Statement on Inpatient Glycemic Control (2015)  Target Ranges:  Prepandial:   less than 140 mg/dL      Peak postprandial:   less than 180 mg/dL (1-2 hours)      Critically ill patients:  140 - 180 mg/dL   Lab Results  Component Value Date   GLUCAP 229 (H) 06/21/2016    Review of Glycemic Control:  Results for ANDREN, BETHEA (MRN [id number]) as of 06/21/2016 10:59  Ref. Range 06/20/2016 12:23 06/20/2016 16:15 06/20/2016 21:14 06/21/2016 00:06 06/21/2016 04:13 06/21/2016 07:31  Glucose-Capillary Latest Ref Range: 65 - 99 mg/dL 239 (H) 186 (H) 170 (H) 196 (H) 194 (H) 229 (H)    Inpatient Diabetes Program Recommendations:    Note that CBG's>goal.  Consider adding Novolog tube feed coverage 2 units q 4 hours.    Thanks, Adah Perl, RN, BC-ADM Inpatient Diabetes Coordinator Pager [phone] (8a-5p)

## 2016-06-21 NOTE — Evaluation (Signed)
Clinical/Bedside Swallow Evaluation Patient Details  Name: Bryan Frank MRN: [id number] Date of Birth: 1945-04-09  Today's Date: 06/21/2016 Time: SLP Start Time (ACUTE ONLY): 20 SLP Stop Time (ACUTE ONLY): 0950 SLP Time Calculation (min) (ACUTE ONLY): 30 min  Past Medical History:  Past Medical History:  Diagnosis Date  . Depression   . Diabetes mellitus ([place])    from [facility] Chart  . Hypertension   . Pneumonia    Past Surgical History: History reviewed. No pertinent surgical history. HPI:  71 year old male with PMH: pna, DM, HTN, GERD and tobacco abuse. Per chart recent admit to [place], [place] hospital 04/2016 for severe COPD exacerbation and dense pneumonia, transferred to [facility]. On 4/21 condition worsened requiring intubation likely due to severe COPD and possible PNA; tracheostomy and PEG placed 4/27.   Assessment / Plan / Recommendation Clinical Impression  Pt demonstrates excellent subjective tolerance of thin liquids, though silent aspiration cannot be ruled out. Occasional oral holding observed with verbal cues needed for pt to transit bolus. Unsure of pts history with PO intake, but may benefit from objective testing to determine if regular POs could be offered as he seemed to enjoy them. Will f/u with FEES. Pt may have ice chips with RN with PMSV in place.  SLP Visit Diagnosis: Dysphagia, oropharyngeal phase (R13.12)    Aspiration Risk  Moderate aspiration risk    Diet Recommendation Ice chips PRN after oral care        Other  Recommendations Oral Care Recommendations: Oral care QID Other Recommendations: Place PMSV during PO intake   Follow up Recommendations Skilled Nursing facility      Frequency and Duration            Prognosis Prognosis for Safe Diet Advancement: Good      Swallow Study   General HPI: 71 year old male with PMH: pna, DM, HTN, GERD and tobacco abuse. Per chart recent admit to [place], [place] hospital 04/2016 for severe COPD  exacerbation and dense pneumonia, transferred to [facility]. On 4/21 condition worsened requiring intubation likely due to severe COPD and possible PNA; tracheostomy and PEG placed 4/27. Type of Study: Bedside Swallow Evaluation Diet Prior to this Study: NPO;PEG tube Temperature Spikes Noted: No Respiratory Status: Trach Collar;Trach Trach Size and Type: #8;Cuff;Deflated;With PMSV in place History of Recent Intubation: No Behavior/Cognition: Alert;Confused;Distractible Oral Cavity Assessment: Within Functional Limits Oral Care Completed by SLP: No Oral Cavity - Dentition: Edentulous Vision: Functional for self-feeding Self-Feeding Abilities: Able to feed self;Needs assist Patient Positioning: Upright in bed Baseline Vocal Quality: Normal Volitional Cough: Cognitively unable to elicit Volitional Swallow: Unable to elicit    Oral/Motor/Sensory Function Overall Oral Motor/Sensory Function: Other (comment) (does not follow commands)   Ice Chips Ice chips: Not tested   Thin Liquid Thin Liquid: Impaired Presentation: Straw;Cup Oral Phase Impairments: Reduced labial seal Oral Phase Functional Implications: Right anterior spillage    Nectar Thick Nectar Thick Liquid: Not tested   Honey Thick Honey Thick Liquid: Not tested   Puree Puree: Within functional limits   Solid   GO   Solid: Not tested       Herbie Baltimore, MA CCC-SLP [phone]  Pedro Oldenburg, Katherene Ponto 06/21/2016,11:38 AM

## 2016-06-21 NOTE — Progress Notes (Signed)
Pharmacy Antibiotic Note  Bryan Frank is a 71 y.o. male transferred to [facility] on 06/14/2016 due to acute renal failure requiring CRRT. Pharmacy has been consulted for Ceftazidime + Levaquin dosing.   The patient is noted to have poor muscle mass however renal function appears to have improved nicely. SCr continues to trend down and is at 0.83, CrCl hard to estimate however UOP good at 1.7 ml/kg/hr.   Plan:  - Continue Ceftazidime 2g IV every 12 hours - Adjust Levaquin to 750 mg IV every 24 hours - Will continue to follow renal function, culture results, LOT, and antibiotic de-escalation plans   Height: 5\' 9"  (175.3 cm) Weight: 164 lb (74.4 kg) IBW/kg (Calculated) : 70.7  Temp (24hrs), Avg:98.6 F (37 C), Min:97.7 F (36.5 C), Max:99.3 F (37.4 C)   Recent Labs Lab 06/14/16 1956  06/16/16 1840 06/17/16 0629 06/18/16 0438 06/19/16 0127 06/19/16 0739 06/20/16 0342 06/21/16 0424  WBC 24.8*  < > 24.6* 22.6* 24.1*  --  28.6* 16.3*  --   CREATININE 2.48*  < > 0.83 0.98 0.97 1.06  --  0.90 0.83  LATICACIDVEN 0.8  --   --   --   --   --   --   --   --   < > = values in this interval not displayed.  Estimated Creatinine Clearance: 82.8 mL/min (by C-G formula based on SCr of 0.83 mg/dL).    No Known Allergies  Antimicrobials this admission: Vanc 6/5 >> 6/7 [name] 6/5>> Levaquin 6/5>>  Microbiology results: 5/31 Respiratory cx: Carbapenemase resistant pseudomonas - I to fortaz, S to amikacin                                   Corynebacterium: no sensitivities                                   Kleb peumo: Pan sensitive with I sensitivities to ampicillin  6/7 Trach asp: negative 6/5 BCx: 1/2 MRSE 6/5 resp cx: normal flora 6/5 UC: multiple species, recollect  Thank you for allowing pharmacy to be a part of this patient's care.  Bryan Frank, PharmD, BCPS Clinical Pharmacist Pager: [phone] Clinical phone for 06/21/2016 from 7a-3:30p: [phone] If after 3:30p, please call main  pharmacy at: [phone] 06/21/2016 12:24 PM

## 2016-06-21 NOTE — Progress Notes (Signed)
PULMONARY / CRITICAL CARE MEDICINE   Name: Bryan Frank MRN: [id number] DOB: 07-02-1945    ADMISSION DATE:  06/14/2016 CONSULTATION DATE:  6/5  REFERRING MD:  [facility] MD  CHIEF COMPLAINT:  Renal failure  HISTORY OF PRESENT ILLNESS:   71 year old male with PMH as below, which is significant for DM2, HLD, GERD, HTN, and tobacco abuse. It appears as though he was admitted to [place], [place] hospital 04/2016 for severe COPD exacerbation and dense pneumonia. Also concern for UTI and treated with broad spectrum antibiotics. He improved and was transferred to [facility] in [place] 4/19. At that time he was on supplemental O2 during the day and BiPAP at night. On 4/21 his condition worsened requiring intubation. Likely due to severe COPD and possible PNA. He was unable to wean and had tracheostomy placed 4/27. He was found to have bilateral pleural effusions and is s/p pleurex catheter and thoracentesis. Cytology so far negative. He also required HD for worsening renal failure. This continued to worsen. Course also complicated by copious respiratory secretions. 6/5 he was transferred to [facility] at family request for further care and continued hemodialysis.   SUBJECTIVE / interval events:  Currently on t collar without distress  VITAL SIGNS: BP 139/63 (BP Location: Right Arm)   Pulse 64   Temp 98.8 F (37.1 C) (Oral)   Resp (!) 28   Ht 5\' 9"  (1.753 m)   Wt 164 lb (74.4 kg)   SpO2 100%   BMI 24.22 kg/m   HEMODYNAMICS:    VENTILATOR SETTINGS: Vent Mode: PRVC FiO2 (%):  [40 %-50 %] 40 % Set Rate:  [14 bmp] 14 bmp Vt Set:  [500 mL] 500 mL PEEP:  [5 cmH20] 5 cmH20 Plateau Pressure:  [15 cmH20-20 cmH20] 19 cmH20  INTAKE / OUTPUT: I/O last 3 completed shifts: In: 6552.3 [I.V.:3015.8; Other:30; NG/GT:2806.5; IV Piggyback:700] Out: 3750 [Urine:3750]  PHYSICAL EXAMINATION: General:  Frail appearing male HEENT:T collar currently without distress PSY:NA due to no follows  comands Neuro: No follows commands CV:HSR  PULM: even/non-labored, lungs bilaterally decreased in bases GB:TDVV, non-tender, bsx4 active with peg Extremities: warm/dry, ++edema  Skin: no rashes or lesions   LABS:  BMET  Recent Labs Lab 06/19/16 0127 06/20/16 0342 06/21/16 0424  NA 144 148* 147*  K 3.9 3.8 3.5  CL 108 111 108  CO2 29 30 31   BUN 82* 79* 67*  CREATININE 1.06 0.90 0.83  GLUCOSE 273* 170* 206*    Electrolytes  Recent Labs Lab 06/16/16 1840 06/17/16 0629 06/18/16 0438 06/19/16 0127 06/20/16 0342 06/21/16 0424  CALCIUM 7.6* 7.5* 7.2* 7.2* 6.9* 7.0*  MG 1.7 1.6* 1.4*  --   --   --   PHOS 1.9* 2.0*  2.0* 2.6  2.6 2.7 3.0 2.3*    CBC  Recent Labs Lab 06/18/16 0438 06/19/16 0739 06/20/16 0342  WBC 24.1* 28.6* 16.3*  HGB 6.9* 9.3* 8.1*  HCT 22.7* 27.2* 26.0*  PLT 318 276 233    Coag's  Recent Labs Lab 06/14/16 1956 06/15/16 0208  APTT  --  39*  INR 1.22  --     Sepsis Markers  Recent Labs Lab 06/14/16 1956 06/14/16 2150 06/15/16 0208 06/16/16 0122  LATICACIDVEN 0.8  --   --   --   PROCALCITON  --  1.59 0.93 0.71    ABG  Recent Labs Lab 06/15/16 1545 06/16/16 1048 06/20/16 0155  PHART 7.425 7.424 7.260*  PCO2ART 41.7 44.2 67.6*  PO2ART 66.6*  62.1* 168*    Liver Enzymes  Recent Labs Lab 06/14/16 1956  06/19/16 0127 06/20/16 0342 06/21/16 0424  AST 101*  --   --   --   --   ALT 88*  --   --   --   --   ALKPHOS 302*  --   --   --   --   BILITOT 0.9  --   --   --   --   ALBUMIN 1.5*  < > 1.7* 1.6* 1.7*  < > = values in this interval not displayed.  Cardiac Enzymes No results for input(s): TROPONINI, PROBNP in the last 168 hours.  Glucose  Recent Labs Lab 06/20/16 1223 06/20/16 1615 06/20/16 2114 06/21/16 0006 06/21/16 0413 06/21/16 0731  GLUCAP 239* 186* 170* 196* 194* 229*    Imaging No results found.   STUDIES:  Ct head 6/5 >>> chronic microvascular ischemic changes without acute  finding  CULTURES: resp cx 5/31 >> Klebsiella (pansensitive), pseudomonas (pan resistant, intermediate to ceftazidime, sensitive to aminoglycoside); Corynebacterium  Respiratory aspirate 6/3>> Klebsiella (pansensitive), pseudomonas (pan resistant, intermediate to ceftazidime, sensitive to aminoglycoside); Corynebacterium  Tracheal aspirate 6/5 >> rare GNR, rare GPC, >>  Blood culture 6/5 >> 1 of 2 coag-negative staph, likely MRSE based on PCR panel Urine 6/5 >> multiple species, suggests recollection  ANTIBIOTICS: Cefepime 5/29 >6/5 Ceftaz 6/5 > Levaquin 6/5 > Vancomycin 6/5 > 6/7  SIGNIFICANT EVENTS: Admit to [facility] 4[facility]  LINES/TUBES: Trach 4/27 > PEG 4/27 > Tunneled RIJ HD cath 6/5 >  DISCUSSION:   ASSESSMENT / PLAN:  PULMONARY A: Acute on chronic hypoxemic and hypercarbic respiratory failure. Severe COPD with +/- exacerbation Pulmonary edema  HCAP - cultures pseudomonas and corynebacterium (no sens done) Bilateral pleural effusions  P:   As expected he was able to stay off mechanical ventilation for several days but then returned to mechanical ventilation this morning. This was his pattern as they were working to wean him at [facility] that he will require nocturnal ventilation at least for an extended period of time. He may be at least partially vent dependent forever.  He should be out of move back to [facility] at any time to continue his ventilator weaning, treatment for his pneumonia. He is now hemodynamically stable and renal function has returned to normal. Follow chest x-ray for resolution atelectasis/infiltrate with reinitiation of mechanical ventilation Continue scheduled DuoNeb, scheduled Pulmicort neb  RENAL Lab Results  Component Value Date   CREATININE 0.83 06/21/2016   CREATININE 0.90 06/20/2016   CREATININE 1.06 06/19/2016    Recent Labs Lab 06/19/16 0127 06/20/16 0342 06/21/16 0424  K 3.9 3.8 3.5     A:    Recurrent (required HD 4/21 - 06/01/16 at [place]) acute renal failure, serologies negative for glomerulonephritis, likely ATN Neurogenic bladder Hyponatremia  P:   He appears to have had a good renal recovery. Has not required CVVH since 6/7. Certainly he has labile as he has had recurrent failure before but hopefully this reflects a full recovery Appreciate nephrology assistance   INFECTIOUS A:   HCAP - cultures pseudomonas, klebsiella, and corynebacterium (no sens done). Had been worsening at [facility] despite treatment with cefepime Likely UTI, first culture multiple species  1 of 2 blood cultures MRSE, likely contaminant  Sacral decub PEG site erythema   P:   Agree with ceftaz, Levaquin Could consider initiation of tobramycin nebulizer but do not believe this is indicated for now Continue  wound care  FAMILY  - Updates: No family present 6/11  - Inter-disciplinary family meet or Palliative Care meeting due by:  6/12 per [facility] service    Richardson Landry Minor ACNP Maryanna Shape PCCM Pager [phone] till 3 pm If no answer page [phone] 06/21/2016, 11:38 AM   Attending Note:  I have examined patient, reviewed labs, studies and notes. I have discussed the case with S Minor, and I agree with the data and plans as amended above. 71 yo man with multifactorial resp failure, tracheostomy. He has tolerated extended period of ATC but not yet fully weaned from MV (required 6/11). Now back on ATC. Note plans to go back to [facility] for continued vent weaning. His renal status appears to have stabilized off HD. He should be finished with his Levaquin and ceftaz (day 8 of 8) today.     Baltazar Apo, MD, PhD 06/21/2016, 2:53 PM [facility] [phone] or if no answer [phone]

## 2016-06-21 NOTE — Discharge Summary (Signed)
[name], is a 71 y.o. male  DOB 11-09-1945  MRN [id number].  Admission date:  06/14/2016  Admitting Physician  Javier Glazier, MD  Discharge Date:  06/21/2016   Primary MD  No primary care provider on file.  Recommendations for primary care physician for things to follow:  - Continue with vent weaning   Admission Diagnosis  RESPIRATORY FAILURE RENAL FAILURE   Discharge Diagnosis  RESPIRATORY FAILURE RENAL FAILURE    Active Problems:   ESRD (end stage renal disease) ()   Acute on chronic alteration in mental status   PNA (pneumonia)   Tracheostomy care ([place])   Pressure injury of skin   Acute on chronic respiratory failure (HCC)      Past Medical History:  Diagnosis Date  . Depression   . Diabetes mellitus ([place])    from [facility]  . Hypertension   . Pneumonia     History reviewed. No pertinent surgical history.     History of present illness and  Hospital Course:     Kindly see H&P for history of present illness and admission details, please review complete Labs, Consult reports and Test reports for all details in brief  HPI  from the history and physical done on the day of admission 06/14/2016 HISTORY OF PRESENT ILLNESS:   71 year old male with PMH as below, which is significant for DM2, HLD, GERD, HTN, and tobacco abuse. It appears as though he was admitted to [place], [place] hospital 04/2016 for severe COPD exacerbation and dense pneumonia. Also concern for UTI and treated with broad spectrum antibiotics. He improved and was transferred to [facility] in [place] 4/19. At that time he was on supplemental O2 during the day and BiPAP at night. On 4/21 his condition worsened requiring intubation. Likely due to severe COPD and possible PNA. He was unable to wean and had tracheostomy placed 4/27. He was found to have bilateral pleural effusions and is s/p pleurex  catheter and thoracentesis. Cytology so far negative. He also required HD for worsening renal failure. This continued to worsen. Course also complicated by copious respiratory secretions. 6/5 he was transferred to [facility] at family request for further care and continued hemodialysis.    Hospital Course   Vent dependent respiratory failure - In the setting of severe COPD at baseline, worsening secondary to pulmonary edema and HCAP, and bilateral pleural effusion. - off vent vent for 96 hours, was on ATC 40%, unfortunately with respiratory decompensation requiring to go back on the vent 06/20/2016 , this is most likely due to right lung base pneumonia. Continue with chest PT, Mucinex and pulmonary toilet. - Per PCCM, Suspect that he will require nocturnal ventilation at least for an extended period of time. He may be at least partially vent dependent forever. , He should be out of move back to LTAC  any time to continue his ventilator weaning, treatment for his pneumonia. He is now hemodynamically stable and renal function has returned to normal. Follow chest x-ray  for resolution atelectasis/infiltrate with reinitiation of mechanical ventilation Continue scheduled DuoNeb, scheduled Pulmicort neb  COPD - No active wheezing, continue with scheduled DuoNeb's, and Pulmicort  HCAP - cultures pseudomonas, klebsiella, and corynebacterium (no sens done). [facility]been worsening at [facility] despite treatment with cefepime. - Discussed with PCCM, continue ceftaz and levofloxacin for total of 10 days, another 7 days on discharge  AKI - Management per renal, required dialysis at [facility], and CRRT upon presentation to [facility], creatinine remained stable off CRRT,  - Per renal :AKI resolved. Has CKD 3 . Cr not representative due to low Ms mass.  No indic for dialysis or intervention so will d/c HD cath.  Hematuria - Secondary to traumatic Foley, appears to be improving, no further hematuria, but still  passing small blood clots were flushed, so continue with Foley flushing every 8 hours.  MRSA bacteremia - 1/2 bottles, most likely contaminant  Hypertension - Blood pressure improved, continue with metoprolol  Paroxysmal A. Fib - Rate controlled on metoprolol,Hold off on anticoagulation until hematuria resolves , meanwhile continue with aspirin  Acute encephalopathy -  etiology unclear but suspect multifactorial: Medications, sepsis, hypercapnia, progressive renal dysfunction  Hyponatremia - Sodium was 148 today, continue D5W, and will increase free water via PEG  Anemia -  gross hematuria contributing, received 2 units PRBC on 6/10,   Sacral pressure ulcer - Continue with wound care   Discharge Condition:  Stable for discharge to LTAC to continue vent weaning    Discharge Instructions  and  Discharge Medications    Discharge Instructions    Discharge instructions    Complete by:  As directed    Management per select LTAC facility     Allergies as of 06/21/2016   No Known Allergies     Medication List    STOP taking these medications   cloNIDine 0.1 MG tablet Commonly known as:  CATAPRES   digoxin 0.125 MG tablet Commonly known as:  LANOXIN   divalproex 125 MG capsule Commonly known as:  DEPAKOTE SPRINKLE   FLUoxetine 40 MG capsule Commonly known as:  PROZAC   HYDROmorphone 2 MG tablet Commonly known as:  DILAUDID   isosorbide dinitrate 20 MG tablet Commonly known as:  ISORDIL   mineral oil enema   nicotine 14 mg/24hr patch Commonly known as:  NICODERM CQ - dosed in mg/24 hours   sennosides-docusate sodium 8.6-50 MG tablet Commonly known as:  SENOKOT-S   simethicone 80 MG chewable tablet Commonly known as:  MYLICON   sterile water for irrigation Replaced by:  free water Soln     TAKE these medications   albuterol (2.5 MG/3ML) 0.083% nebulizer solution Commonly known as:  PROVENTIL Take 2.5 mg by nebulization every 6 (six) hours  as needed for wheezing or shortness of breath.   aspirin EC 81 MG tablet 81 mg daily.   atorvastatin 10 MG tablet Commonly known as:  LIPITOR Place 1 tablet (10 mg total) into feeding tube daily. What changed:  how to take this   bacitracin ointment Apply topically 2 (two) times daily. Apply at PEG site   bisacodyl 10 MG suppository Commonly known as:  DULCOLAX Place 10 mg rectally every 12 (twelve) hours as needed for moderate constipation.   budesonide 0.5 MG/2ML nebulizer solution Commonly known as:  PULMICORT Take 2 mLs (0.5 mg total) by nebulization 2 (two) times daily.   cefTAZidime 2 g in dextrose 5 % 50 mL Inject 2 g into the vein every 12 (twelve) hours.  Continue for 7 days   chlorhexidine gluconate (MEDLINE KIT) 0.12 % solution Commonly known as:  PERIDEX 15 mLs by Mouth Rinse route 2 (two) times daily.   collagenase ointment Commonly known as:  SANTYL Apply topically daily. Start taking on:  06/22/2016   Darbepoetin Alfa 200 MCG/0.4ML Sosy injection Commonly known as:  ARANESP Inject 0.4 mLs (200 mcg total) into the skin every Wednesday at 6 PM. Start taking on:  06/22/2016   dextrose 5 % solution Inject 25 mLs into the vein continuous.   famotidine 20 MG tablet Commonly known as:  PEPCID Place 20 mg into feeding tube daily.   feeding supplement (PRO-STAT SUGAR FREE 64) Liqd Place 30 mLs into feeding tube 2 (two) times daily.   feeding supplement (VITAL AF 1.2 CAL) Liqd Place 1,000 mLs into feeding tube continuous.   free water Soln Place 250 mLs into feeding tube every 6 (six) hours. Replaces:  sterile water for irrigation   heparin 5000 UNIT/ML injection Inject 1 mL (5,000 Units total) into the skin every 8 (eight) hours.   insulin aspart 100 UNIT/ML injection Commonly known as:  novoLOG Inject 0-15 Units into the skin every 4 (four) hours.   insulin glargine 100 UNIT/ML injection Commonly known as:  LANTUS Inject 0.1 mLs (10 Units total)  into the skin daily. Start taking on:  06/22/2016   ipratropium-albuterol 0.5-2.5 (3) MG/3ML Soln Commonly known as:  DUONEB Take 3 mLs by nebulization every 6 (six) hours.   levofloxacin 500 MG/100ML Soln Commonly known as:  LEVAQUIN Inject 100 mLs (500 mg total) into the vein every other day. To finish total of 7 days   metoprolol tartrate 50 MG tablet Commonly known as:  LOPRESSOR Place 2 tablets (100 mg total) into feeding tube 2 (two) times daily. What changed:  how much to take         Diet and Activity recommendation: See Discharge Instructions above   Consults obtained -  PCCM Renal   Major procedures and Radiology Reports - PLEASE review detailed and final reports for all details, in brief -   Temporary dialysis catheter will be removed by IR. Discharge.  Ct Head Wo Contrast  Result Date: 06/14/2016 CLINICAL DATA:  Altered mental status EXAM: CT HEAD WITHOUT CONTRAST TECHNIQUE: Contiguous axial images were obtained from the base of the skull through the vertex without intravenous contrast. COMPARISON:  None. FINDINGS: Brain: No mass lesion, intraparenchymal hemorrhage or extra-axial collection. No evidence of acute cortical infarct. There is periventricular hypoattenuation compatible with chronic microvascular disease. There is diffuse atrophy without lobar predominance. Vascular: Atherosclerotic calcification of the vertebral and internal carotid arteries at the skull base. Skull: Normal visualized skull base, calvarium and extracranial soft tissues. Sinuses/Orbits: No sinus fluid levels or advanced mucosal thickening. No mastoid effusion. Normal orbits. IMPRESSION: Chronic microvascular ischemia without acute intracranial abnormality. Electronically Signed   By: Ulyses Jarred M.D.   On: 06/14/2016 21:20   US Renal  Result Date: 06/16/2016 CLINICAL DATA:  Hematuria. EXAM: RENAL / URINARY TRACT ULTRASOUND COMPLETE COMPARISON:  No prior. FINDINGS: Right Kidney: Length:  14.9 cm. Echogenicity within normal limits. Prominent column of Bertin. If symptoms persist CT can be performed . No mass or hydronephrosis visualized. Left Kidney: Length: 14.7 cm. Echogenicity within normal limits. No mass or hydronephrosis visualized. Bladder: Foley catheter bladder.  Bladder decompressed. IMPRESSION: No acute or focal abnormality identified. No hydronephrosis. Bladder is decompressed by a Foley catheter. Electronically Signed   By: Marcello Moores  Register  On: 06/16/2016 06:16   Dg Chest Port 1 View  Result Date: 06/19/2016 CLINICAL DATA:  Leukocytosis. EXAM: PORTABLE CHEST 1 VIEW COMPARISON:  06/14/2016 chest radiograph FINDINGS: New right lower lung opacity noted -question atelectasis versus airspace disease. Improved aeration in the left lower lung noted. Tracheostomy tube and right IJ central venous catheter with tip overlying the superior cavoatrial junction again noted. Cardiomegaly is identified. There is no evidence of pneumothorax or pleural effusion. IMPRESSION: New right lower lung opacity -atelectasis versus airspace disease/ pneumonia. Improved left lower lung aeration. Electronically Signed   By: Margarette Canada M.D.   On: 06/19/2016 10:49   Dg Chest Port 1 View  Result Date: 06/14/2016 CLINICAL DATA:  Pneumonia. EXAM: PORTABLE CHEST 1 VIEW COMPARISON:  None. FINDINGS: Patient is rotated to the left. Tracheostomy tube appears in adequate position. There is a right IJ central venous catheter with tip just below the cavoatrial junction. Lungs are adequately inflated with opacification over the left base which may be due to effusion and atelectasis although cannot exclude infection. Mild cardiomegaly. Calcified plaque is present over the aortic arch. There is minimal degenerate change of the spine. IMPRESSION: Left base opacification which may be due to atelectasis and effusion versus infection. Follow-up PA and lateral chest x-ray would be helpful for further evaluation. Cardiomegaly.  Aortic atherosclerosis. Tubes and lines as described. Electronically Signed   By: Marin Olp M.D.   On: 06/14/2016 20:08    Micro Results    Recent Results (from the past 240 hour(s))  MRSA PCR Screening     Status: None   Collection Time: 06/14/16  7:00 PM  Result Value Ref Range Status   MRSA by PCR NEGATIVE NEGATIVE Final    Comment:        The GeneXpert MRSA Assay (FDA approved for NASAL specimens only), is one component of a comprehensive MRSA colonization surveillance program. It is not intended to diagnose MRSA infection nor to guide or monitor treatment for MRSA infections.   Culture, Urine     Status: Abnormal   Collection Time: 06/14/16  7:34 PM  Result Value Ref Range Status   Specimen Description URINE, RANDOM  Final   Special Requests NONE  Final   Culture MULTIPLE SPECIES PRESENT, SUGGEST RECOLLECTION (A)  Final   Report Status 06/16/2016 FINAL  Final  Culture, respiratory (NON-Expectorated)     Status: None   Collection Time: 06/14/16  7:34 PM  Result Value Ref Range Status   Specimen Description TRACHEAL ASPIRATE  Final   Special Requests NONE  Final   Gram Stain   Final    MODERATE WBC PRESENT, PREDOMINANTLY PMN FEW SQUAMOUS EPITHELIAL CELLS PRESENT FEW GRAM POSITIVE RODS RARE GRAM NEGATIVE RODS RARE GRAM POSITIVE COCCI IN PAIRS    Culture Consistent with normal respiratory flora.  Final   Report Status 06/16/2016 FINAL  Final  Culture, blood (Routine X 2) w Reflex to ID Panel     Status: Abnormal   Collection Time: 06/14/16  7:40 PM  Result Value Ref Range Status   Specimen Description BLOOD RIGHT ANTECUBITAL  Final   Special Requests IN PEDIATRIC BOTTLE Blood Culture adequate volume  Final   Culture  Setup Time   Final    GRAM POSITIVE COCCI IN PEDIATRIC BOTTLE CRITICAL RESULT CALLED TO, READ BACK BY AND VERIFIED WITH: T DANG PHARMD 1920 06/15/16 A BROWNING    Culture (A)  Final    STAPHYLOCOCCUS SPECIES (COAGULASE NEGATIVE) THE SIGNIFICANCE  OF ISOLATING THIS  ORGANISM FROM A SINGLE SET OF BLOOD CULTURES WHEN MULTIPLE SETS ARE DRAWN IS UNCERTAIN. PLEASE NOTIFY THE MICROBIOLOGY DEPARTMENT WITHIN ONE WEEK IF SPECIATION AND SENSITIVITIES ARE REQUIRED.    Report Status 06/16/2016 FINAL  Final  Blood Culture ID Panel (Reflexed)     Status: Abnormal   Collection Time: 06/14/16  7:40 PM  Result Value Ref Range Status   Enterococcus species NOT DETECTED NOT DETECTED Final   Listeria monocytogenes NOT DETECTED NOT DETECTED Final   Staphylococcus species DETECTED (A) NOT DETECTED Final    Comment: Methicillin (oxacillin) resistant coagulase negative staphylococcus. Possible blood culture contaminant (unless isolated from more than one blood culture draw or clinical case suggests pathogenicity). No antibiotic treatment is indicated for blood  culture contaminants. CRITICAL RESULT CALLED TO, READ BACK BY AND VERIFIED WITH: T DANG PHARMD 1920 06/15/16 A BROWNING    Staphylococcus aureus NOT DETECTED NOT DETECTED Final   Methicillin resistance DETECTED (A) NOT DETECTED Final    Comment: CRITICAL RESULT CALLED TO, READ BACK BY AND VERIFIED WITH: T DANG PHARMD 1920 06/15/16 A BROWNING    Streptococcus species NOT DETECTED NOT DETECTED Final   Streptococcus agalactiae NOT DETECTED NOT DETECTED Final   Streptococcus pneumoniae NOT DETECTED NOT DETECTED Final   Streptococcus pyogenes NOT DETECTED NOT DETECTED Final   Acinetobacter baumannii NOT DETECTED NOT DETECTED Final   Enterobacteriaceae species NOT DETECTED NOT DETECTED Final   Enterobacter cloacae complex NOT DETECTED NOT DETECTED Final   Escherichia coli NOT DETECTED NOT DETECTED Final   Klebsiella oxytoca NOT DETECTED NOT DETECTED Final   Klebsiella pneumoniae NOT DETECTED NOT DETECTED Final   Proteus species NOT DETECTED NOT DETECTED Final   Serratia marcescens NOT DETECTED NOT DETECTED Final   Haemophilus influenzae NOT DETECTED NOT DETECTED Final   Neisseria meningitidis NOT DETECTED  NOT DETECTED Final   Pseudomonas aeruginosa NOT DETECTED NOT DETECTED Final   Candida albicans NOT DETECTED NOT DETECTED Final   Candida glabrata NOT DETECTED NOT DETECTED Final   Candida krusei NOT DETECTED NOT DETECTED Final   Candida parapsilosis NOT DETECTED NOT DETECTED Final   Candida tropicalis NOT DETECTED NOT DETECTED Final  Culture, blood (Routine X 2) w Reflex to ID Panel     Status: None   Collection Time: 06/14/16  7:50 PM  Result Value Ref Range Status   Specimen Description BLOOD RIGHT HAND  Final   Special Requests IN PEDIATRIC BOTTLE Blood Culture adequate volume  Final   Culture NO GROWTH 5 DAYS  Final   Report Status 06/19/2016 FINAL  Final  Culture, respiratory (NON-Expectorated)     Status: None   Collection Time: 06/16/16 10:44 AM  Result Value Ref Range Status   Specimen Description TRACHEAL ASPIRATE  Final   Special Requests NONE  Final   Gram Stain   Final    RARE WBC PRESENT, PREDOMINANTLY MONONUCLEAR NO ORGANISMS SEEN    Culture Consistent with normal respiratory flora.  Final   Report Status 06/17/2016 FINAL  Final       Today   Subjective:   [place] today  Is non-verbal, cannot provide any complaints, no significant events overnight.  Objective:   Blood pressure 139/63, pulse 63, temperature 98.8 F (37.1 C), temperature source Oral, resp. rate (!) 22, height '5\' 9"'  (1.753 m), weight 74.4 kg (164 lb), SpO2 100 %.   Intake/Output Summary (Last 24 hours) at 06/21/16 1156 Last data filed at 06/21/16 1100  Gross per 24 hour  Intake  4023.33 ml  Output             2050 ml  Net          1973.33 ml    Exam Open eyes, follow simple commands Trach with no increased secretion, back on the vent Good air entry bilaterally, no wheezing, right base lung Rales Regular rate and rhythm, no rales murmurs gallops Abdomen soft, nontender, nondistended, bowel sounds present, some mild erythema around PEG  Site Extremities with no edema,  clubbing or cyanosis, has significant muscle wasting, and delayed skin turgor  Data Review   CBC w Diff:  Lab Results  Component Value Date   WBC 16.3 (H) 06/20/2016   HGB 8.1 (L) 06/20/2016   HCT 26.0 (L) 06/20/2016   PLT 233 06/20/2016   LYMPHOPCT 5 06/14/2016   MONOPCT 3 06/14/2016   EOSPCT 0 06/14/2016   BASOPCT 1 06/14/2016    CMP:  Lab Results  Component Value Date   NA 147 (H) 06/21/2016   K 3.5 06/21/2016   CL 108 06/21/2016   CO2 31 06/21/2016   BUN 67 (H) 06/21/2016   CREATININE 0.83 06/21/2016   PROT 6.2 (L) 06/14/2016   ALBUMIN 1.7 (L) 06/21/2016   BILITOT 0.9 06/14/2016   ALKPHOS 302 (H) 06/14/2016   AST 101 (H) 06/14/2016   ALT 88 (H) 06/14/2016  .   Total Time in preparing paper work, data evaluation and todays exam - 35 minutes  Tyesha Joffe M.D on 06/21/2016 at 11:56 AM  [facility]   Office  [phone]

## 2016-06-21 NOTE — Discharge Instructions (Signed)
Management per select LTAC facility

## 2016-06-21 NOTE — Progress Notes (Signed)
Referring Physician(s): Dr Noelle Penner Dr Lenna Sciara Deterding  Supervising Physician: Corrie Mckusick  Patient Status:  [facility] [place] - In-pt  Chief Complaint:  AKI resolving No indication for continued dialysis  Subjective:  From [place]; HTN; tob abuse COPD exacerbation/ PNA Tx to [facility] but worsened and required intubation +trach 05/06/16 Vent since 06/16/16 Required HD catheter placement secondary AKI - now resolved Nephrology requesting removal  Allergies: Patient has no known allergies.  Medications: Prior to Admission medications   Medication Sig Start Date End Date Taking? Authorizing Provider  albuterol (PROVENTIL) (2.5 MG/3ML) 0.083% nebulizer solution Take 2.5 mg by nebulization every 6 (six) hours as needed for wheezing or shortness of breath.   Yes [provider]  aspirin EC 81 MG tablet 81 mg daily.   Yes [provider]  atorvastatin (LIPITOR) 10 MG tablet Take 10 mg by mouth daily.   Yes [provider]  bisacodyl (DULCOLAX) 10 MG suppository Place 10 mg rectally every 12 (twelve) hours as needed for moderate constipation.   Yes [provider]  cloNIDine (CATAPRES) 0.1 MG tablet Place 0.1 mg into feeding tube every 4 (four) hours as needed.   Yes [provider]  digoxin (LANOXIN) 0.125 MG tablet Place 0.125 mg into feeding tube every 3 (three) days.   Yes [provider]  divalproex (DEPAKOTE SPRINKLE) 125 MG capsule 125 mg at bedtime.   Yes [provider]  famotidine (PEPCID) 20 MG tablet Place 20 mg into feeding tube daily.   Yes [provider]  FLUoxetine (PROZAC) 40 MG capsule Take 40 mg by mouth daily.   Yes [provider]  HYDROmorphone (DILAUDID) 2 MG tablet Take 2 mg by mouth every 6 (six) hours as needed for severe pain.   Yes [provider]  ipratropium-albuterol (DUONEB) 0.5-2.5 (3) MG/3ML SOLN Take 3 mLs by nebulization every 6 (six) hours.   Yes [provider]  isosorbide dinitrate (ISORDIL) 20 MG tablet Place 20 mg into feeding tube every 8 (eight) hours.   Yes [provider]  metoprolol tartrate (LOPRESSOR) 50 MG tablet Place 50 mg into feeding tube 2 (two) times daily.   Yes [provider]  mineral oil enema Place 1 enema rectally every 12 (twelve) hours as needed for severe constipation.   Yes [provider]  nicotine (NICODERM CQ - DOSED IN MG/24 HOURS) 14 mg/24hr patch Place 14 mg onto the skin daily.   Yes [provider]  sennosides-docusate sodium (SENOKOT-S) 8.6-50 MG tablet Place 1 tablet into feeding tube daily as needed for constipation.   Yes [provider]  simethicone (MYLICON) 80 MG chewable tablet Place 120 mg into feeding tube every 6 (six) hours.   Yes [provider]  Water For Irrigation, Sterile (STERILE WATER FOR IRRIGATION) Irrigate with 150 mLs as directed every 6 (six) hours.   Yes [provider]     Vital Signs: BP (!) 136/57   Pulse 74   Temp 98.8 F (37.1 C) (Oral)   Resp (!) 24   Ht 5\' 9"  (1.753 m)   Wt 164 lb (74.4 kg)   SpO2 100%   BMI 24.22 kg/m   Physical Exam  Cardiovascular: Normal rate and regular rhythm.   Pulmonary/Chest:  Vent/trach  Abdominal: Soft.  Neurological: He is alert.  Skin: Skin is warm.  Psychiatric:  Consented with dtr via phone  Nursing note and vitals reviewed.   Imaging: Dg Chest [facility] [address]  Result Date: 06/19/2016 CLINICAL DATA:  Leukocytosis. EXAM: PORTABLE CHEST 1 VIEW COMPARISON:  06/14/2016 chest radiograph FINDINGS: New right lower lung opacity noted -question atelectasis versus airspace disease. Improved aeration in the left lower lung noted. Tracheostomy tube and right IJ central venous catheter with tip overlying the superior cavoatrial junction again noted. Cardiomegaly is identified. There is no evidence of pneumothorax or pleural effusion. IMPRESSION: New right lower lung opacity  -atelectasis versus airspace disease/ pneumonia. Improved left lower lung aeration. Electronically Signed   By: Margarette Canada M.D.   On: 06/19/2016 10:49    Labs:  CBC:  Recent Labs  06/17/16 0629 06/18/16 0438 06/19/16 0739 06/20/16 0342  WBC 22.6* 24.1* 28.6* 16.3*  HGB 7.2* 6.9* 9.3* 8.1*  HCT 23.3* 22.7* 27.2* 26.0*  PLT 335 318 276 233    COAGS:  Recent Labs  06/14/16 1956 06/15/16 0208  INR 1.22  --   APTT  --  39*    BMP:  Recent Labs  06/18/16 0438 06/19/16 0127 06/20/16 0342 06/21/16 0424  NA 145 144 148* 147*  K 3.7 3.9 3.8 3.5  CL 107 108 111 108  CO2 29 29 30 31   GLUCOSE 180* 273* 170* 206*  BUN 74* 82* 79* 67*  CALCIUM 7.2* 7.2* 6.9* 7.0*  CREATININE 0.97 1.06 0.90 0.83  GFRNONAA >60 >60 >60 >60  GFRAA >60 >60 >60 >60    LIVER FUNCTION TESTS:  Recent Labs  06/14/16 1956  06/18/16 0438 06/19/16 0127 06/20/16 0342 06/21/16 0424  BILITOT 0.9  --   --   --   --   --   AST 101*  --   --   --   --   --   ALT 88*  --   --   --   --   --   ALKPHOS 302*  --   --   --   --   --   PROT 6.2*  --   --   --   --   --   ALBUMIN 1.5*  < > 1.5* 1.7* 1.6* 1.7*  < > = values in this interval not displayed.  Assessment and Plan:  Acute kidney injury- requiring HD placement Now resolving injury- Cr level is wnl For removal per Nephrology pts daughter is aware of risks and benefits including but not limited to Infection; bleeding; vessel damage Agreeable and voiced consent via phone Consent signe and in chart  Electronically Signed: Nahshon Reich A, PA-C 06/21/2016, 9:18 AM   I spent a total of 25 Minutes at the the patient's bedside AND on the patient's hospital floor or unit, greater than 50% of which was counseling/coordinating care for tunneled HD cath removal

## 2016-06-22 ENCOUNTER — Other Ambulatory Visit (HOSPITAL_COMMUNITY): Payer: Self-pay

## 2016-06-22 LAB — COMPREHENSIVE METABOLIC PANEL
ALBUMIN: 2 g/dL — AB (ref 3.5–5.0)
ALT: 64 U/L — ABNORMAL HIGH (ref 17–63)
ANION GAP: 13 (ref 5–15)
AST: 52 U/L — ABNORMAL HIGH (ref 15–41)
Alkaline Phosphatase: 280 U/L — ABNORMAL HIGH (ref 38–126)
BUN: 55 mg/dL — ABNORMAL HIGH (ref 6–20)
CO2: 25 mmol/L (ref 22–32)
Calcium: 6.8 mg/dL — ABNORMAL LOW (ref 8.9–10.3)
Chloride: 111 mmol/L (ref 101–111)
Creatinine, Ser: 0.88 mg/dL (ref 0.61–1.24)
GFR calc Af Amer: >60 mL/min (ref >60)
GFR calc non Af Amer: >60 mL/min (ref >60)
GLUCOSE: 141 mg/dL — AB (ref 65–99)
POTASSIUM: 3.5 mmol/L (ref 3.5–5.1)
SODIUM: 149 mmol/L — AB (ref 135–145)
TOTAL PROTEIN: 5.7 g/dL — AB (ref 6.5–8.1)
Total Bilirubin: 0.9 mg/dL (ref 0.3–1.2)

## 2016-06-22 LAB — CBC
HEMATOCRIT: 26.7 % — AB (ref 39.0–52.0)
HEMOGLOBIN: 9.3 g/dL — AB (ref 13.0–17.0)
MCH: 36.2 pg — AB (ref 26.0–34.0)
MCHC: 34.8 g/dL (ref 30.0–36.0)
MCV: 103.9 fL — ABNORMAL HIGH (ref 78.0–100.0)
Platelets: 249 10*3/uL (ref 150–400)
RBC: 2.57 MIL/uL — AB (ref 4.22–5.81)
RDW: 20.2 % — ABNORMAL HIGH (ref 11.5–15.5)
WBC: 12.5 10*3/uL — ABNORMAL HIGH (ref 4.0–10.5)

## 2016-06-22 LAB — TSH: TSH: 5.969 u[IU]/mL — ABNORMAL HIGH (ref 0.350–4.500)

## 2016-06-23 ENCOUNTER — Other Ambulatory Visit (HOSPITAL_COMMUNITY): Payer: Self-pay

## 2016-06-23 ENCOUNTER — Encounter (HOSPITAL_COMMUNITY): Payer: Self-pay | Admitting: Interventional Radiology

## 2016-06-23 HISTORY — PX: IR REPLC GASTRO/COLONIC TUBE PERCUT W/FLUORO: IMG2333

## 2016-06-23 MED ORDER — LIDOCAINE VISCOUS 2 % MT SOLN
OROMUCOSAL | Status: DC | PRN
  Administered 2016-06-23: 10 mL via OROMUCOSAL

## 2016-06-23 MED ORDER — IOPAMIDOL (ISOVUE-300) INJECTION 61%
INTRAVENOUS | Status: AC
  Administered 2016-06-23: 10 mL
  Filled 2016-06-23: qty 50

## 2016-06-23 MED ORDER — LIDOCAINE VISCOUS 2 % MT SOLN
OROMUCOSAL | Status: AC
  Filled 2016-06-23: qty 15

## 2016-06-23 NOTE — Procedures (Signed)
Successful fluoroscopic guided replacement of 18 Fr gastrostomy tube.  No immediate post procedural complications.  The feeding tube is ready for immediate use.  Ronny Bacon, MD Pager #: [phone]

## 2016-06-24 LAB — BASIC METABOLIC PANEL
Anion gap: 11 (ref 5–15)
BUN: 34 mg/dL — AB (ref 6–20)
CO2: 30 mmol/L (ref 22–32)
Calcium: 6.9 mg/dL — ABNORMAL LOW (ref 8.9–10.3)
Chloride: 112 mmol/L — ABNORMAL HIGH (ref 101–111)
Creatinine, Ser: 1.01 mg/dL (ref 0.61–1.24)
Glucose, Bld: 114 mg/dL — ABNORMAL HIGH (ref 65–99)
POTASSIUM: 3.6 mmol/L (ref 3.5–5.1)
SODIUM: 153 mmol/L — AB (ref 135–145)

## 2016-06-25 NOTE — Procedures (Signed)
30 Fr gastrostomy tube just recently replaced 6/14 has been dislodged again.  Successfully replaced at bedside with new 18 Fr balloon retention G-tube. Flushes easily. Balloon inflated to 9mL, bumper secured. Maintain abdominal binder.  May resume TF.  Ascencion Dike PA-C Interventional Radiology 06/25/2016 1:45 PM

## 2016-06-26 ENCOUNTER — Encounter (HOSPITAL_COMMUNITY): Payer: Self-pay | Admitting: Radiology

## 2016-06-26 ENCOUNTER — Other Ambulatory Visit (HOSPITAL_COMMUNITY): Payer: Self-pay

## 2016-06-26 HISTORY — PX: IR REPLACE G-TUBE SIMPLE WO FLUORO: IMG2323

## 2016-06-27 LAB — CBC
HCT: 31.4 % — ABNORMAL LOW (ref 39.0–52.0)
HEMOGLOBIN: 9.6 g/dL — AB (ref 13.0–17.0)
MCH: 30.7 pg (ref 26.0–34.0)
MCHC: 30.6 g/dL (ref 30.0–36.0)
MCV: 100.3 fL — ABNORMAL HIGH (ref 78.0–100.0)
Platelets: 183 10*3/uL (ref 150–400)
RBC: 3.13 MIL/uL — ABNORMAL LOW (ref 4.22–5.81)
RDW: 18.9 % — ABNORMAL HIGH (ref 11.5–15.5)
WBC: 5.7 10*3/uL (ref 4.0–10.5)

## 2016-06-27 LAB — BASIC METABOLIC PANEL
Anion gap: 8 (ref 5–15)
BUN: 20 mg/dL (ref 6–20)
CHLORIDE: 106 mmol/L (ref 101–111)
CO2: 30 mmol/L (ref 22–32)
CREATININE: 0.79 mg/dL (ref 0.61–1.24)
Calcium: 7.2 mg/dL — ABNORMAL LOW (ref 8.9–10.3)
GFR calc Af Amer: >60 mL/min (ref >60)
GFR calc non Af Amer: >60 mL/min (ref >60)
GLUCOSE: 106 mg/dL — AB (ref 65–99)
Potassium: 3.4 mmol/L — ABNORMAL LOW (ref 3.5–5.1)
Sodium: 144 mmol/L (ref 135–145)

## 2016-06-28 LAB — POTASSIUM: Potassium: 4.1 mmol/L (ref 3.5–5.1)

## 2016-07-01 LAB — BASIC METABOLIC PANEL
ANION GAP: 7 (ref 5–15)
BUN: 26 mg/dL — AB (ref 6–20)
CHLORIDE: 101 mmol/L (ref 101–111)
CO2: 35 mmol/L — AB (ref 22–32)
Calcium: 8.2 mg/dL — ABNORMAL LOW (ref 8.9–10.3)
Creatinine, Ser: 0.66 mg/dL (ref 0.61–1.24)
GFR calc Af Amer: >60 mL/min (ref >60)
GLUCOSE: 125 mg/dL — AB (ref 65–99)
POTASSIUM: 4.6 mmol/L (ref 3.5–5.1)
Sodium: 143 mmol/L (ref 135–145)

## 2016-07-01 LAB — CBC WITH DIFFERENTIAL/PLATELET
BASOS ABS: 0 10*3/uL (ref 0.0–0.1)
Basophils Relative: 0 %
Eosinophils Absolute: 0.2 10*3/uL (ref 0.0–0.7)
Eosinophils Relative: 4 %
HEMATOCRIT: 32.7 % — AB (ref 39.0–52.0)
HEMOGLOBIN: 9.6 g/dL — AB (ref 13.0–17.0)
LYMPHS ABS: 1.2 10*3/uL (ref 0.7–4.0)
LYMPHS PCT: 21 %
MCH: 29.7 pg (ref 26.0–34.0)
MCHC: 29.4 g/dL — ABNORMAL LOW (ref 30.0–36.0)
MCV: 101.2 fL — AB (ref 78.0–100.0)
Monocytes Absolute: 0.3 10*3/uL (ref 0.1–1.0)
Monocytes Relative: 6 %
NEUTROS ABS: 3.7 10*3/uL (ref 1.7–7.7)
NEUTROS PCT: 69 %
PLATELETS: 142 10*3/uL — AB (ref 150–400)
RBC: 3.23 MIL/uL — AB (ref 4.22–5.81)
RDW: 17.9 % — ABNORMAL HIGH (ref 11.5–15.5)
WBC: 5.4 10*3/uL (ref 4.0–10.5)

## 2016-07-05 ENCOUNTER — Other Ambulatory Visit (HOSPITAL_COMMUNITY): Payer: Self-pay

## 2016-07-05 LAB — BASIC METABOLIC PANEL
Anion gap: 12 (ref 5–15)
BUN: 34 mg/dL — AB (ref 6–20)
CALCIUM: 8.4 mg/dL — AB (ref 8.9–10.3)
CO2: 31 mmol/L (ref 22–32)
Chloride: 99 mmol/L — ABNORMAL LOW (ref 101–111)
Creatinine, Ser: 0.86 mg/dL (ref 0.61–1.24)
GFR calc Af Amer: >60 mL/min (ref >60)
GFR calc non Af Amer: >60 mL/min (ref >60)
GLUCOSE: 96 mg/dL (ref 65–99)
Potassium: 4.7 mmol/L (ref 3.5–5.1)
Sodium: 142 mmol/L (ref 135–145)

## 2016-07-06 LAB — TSH: TSH: 5.814 u[IU]/mL — AB (ref 0.350–4.500)

## 2016-07-06 LAB — VITAMIN B12: Vitamin B-12: 938 pg/mL — ABNORMAL HIGH (ref 180–914)

## 2016-07-09 ENCOUNTER — Other Ambulatory Visit (HOSPITAL_COMMUNITY): Payer: Self-pay

## 2016-07-09 LAB — COMPREHENSIVE METABOLIC PANEL
ALBUMIN: 2.4 g/dL — AB (ref 3.5–5.0)
ALK PHOS: 122 U/L (ref 38–126)
ALT: 22 U/L (ref 17–63)
ANION GAP: 8 (ref 5–15)
AST: 28 U/L (ref 15–41)
BILIRUBIN TOTAL: 0.4 mg/dL (ref 0.3–1.2)
BUN: 34 mg/dL — ABNORMAL HIGH (ref 6–20)
CALCIUM: 8.4 mg/dL — AB (ref 8.9–10.3)
CO2: 32 mmol/L (ref 22–32)
CREATININE: 0.88 mg/dL (ref 0.61–1.24)
Chloride: 96 mmol/L — ABNORMAL LOW (ref 101–111)
GFR calc non Af Amer: >60 mL/min (ref >60)
GLUCOSE: 121 mg/dL — AB (ref 65–99)
Potassium: 5 mmol/L (ref 3.5–5.1)
SODIUM: 136 mmol/L (ref 135–145)
TOTAL PROTEIN: 6.1 g/dL — AB (ref 6.5–8.1)

## 2016-07-09 LAB — CBC
HCT: 31.2 % — ABNORMAL LOW (ref 39.0–52.0)
Hemoglobin: 9.4 g/dL — ABNORMAL LOW (ref 13.0–17.0)
MCH: 29.3 pg (ref 26.0–34.0)
MCHC: 30.1 g/dL (ref 30.0–36.0)
MCV: 97.2 fL (ref 78.0–100.0)
PLATELETS: 189 10*3/uL (ref 150–400)
RBC: 3.21 MIL/uL — ABNORMAL LOW (ref 4.22–5.81)
RDW: 16.1 % — AB (ref 11.5–15.5)
WBC: 7.6 10*3/uL (ref 4.0–10.5)

## 2016-07-13 LAB — BASIC METABOLIC PANEL
Anion gap: 9 (ref 5–15)
Anion gap: 10 (ref 5–15)
BUN: 30 mg/dL — ABNORMAL HIGH (ref 6–20)
BUN: 30 mg/dL — ABNORMAL HIGH (ref 6–20)
CHLORIDE: 97 mmol/L — AB (ref 101–111)
CHLORIDE: 94 mmol/L — AB (ref 101–111)
CO2: 26 mmol/L (ref 22–32)
CO2: 27 mmol/L (ref 22–32)
CREATININE: 0.82 mg/dL (ref 0.61–1.24)
CREATININE: 0.86 mg/dL (ref 0.61–1.24)
Calcium: 8.8 mg/dL — ABNORMAL LOW (ref 8.9–10.3)
Calcium: 8.6 mg/dL — ABNORMAL LOW (ref 8.9–10.3)
Glucose, Bld: 96 mg/dL (ref 65–99)
Glucose, Bld: 146 mg/dL — ABNORMAL HIGH (ref 65–99)
POTASSIUM: 5.8 mmol/L — AB (ref 3.5–5.1)
POTASSIUM: 4.8 mmol/L (ref 3.5–5.1)
SODIUM: 132 mmol/L — AB (ref 135–145)
SODIUM: 131 mmol/L — AB (ref 135–145)

## 2016-07-13 LAB — CBC
HCT: 34.7 % — ABNORMAL LOW (ref 39.0–52.0)
HEMOGLOBIN: 10.7 g/dL — AB (ref 13.0–17.0)
MCH: 29.3 pg (ref 26.0–34.0)
MCHC: 30.8 g/dL (ref 30.0–36.0)
MCV: 95.1 fL (ref 78.0–100.0)
Platelets: 193 10*3/uL (ref 150–400)
RBC: 3.65 MIL/uL — AB (ref 4.22–5.81)
RDW: 15.6 % — ABNORMAL HIGH (ref 11.5–15.5)
WBC: 8.5 10*3/uL (ref 4.0–10.5)

## 2017-12-10 DEATH — deceased

## 2018-03-13 IMAGING — CR DG ABD PORTABLE 1V
1 series · 1 of 1 positions shown · non-contrast
Comparison: None.

CLINICAL DATA: Evaluate PEG placement

EXAM:
PORTABLE ABDOMEN - 1 VIEW

[AP]
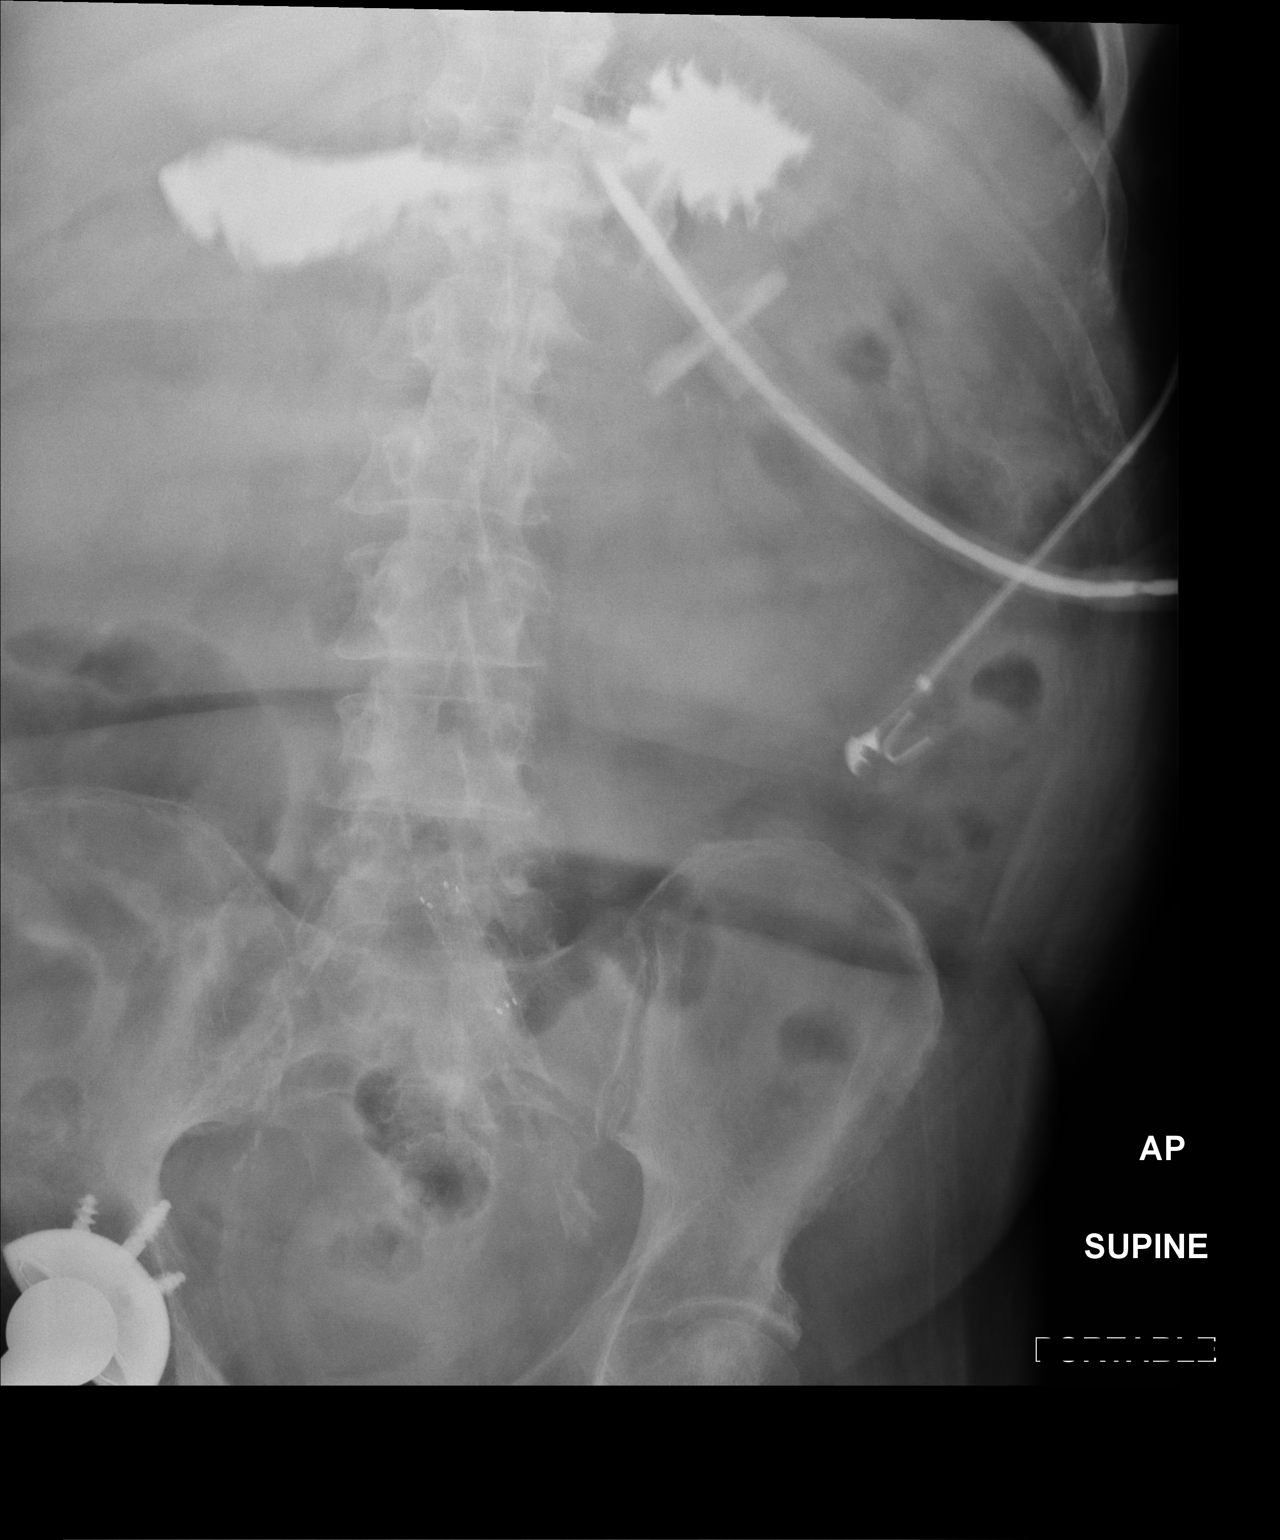

[1 of 1 positions shown; findings below may reference images not displayed]

FINDINGS: The tip of a PEG tube is situated within the proximal body of the
stomach as outlined by enteric contrast injected through the tube.
No extravasation of contrast is noted. Bowel gas pattern is
unremarkable. Aortoiliac atherosclerosis with common iliac vascular
stent is noted on the left. The patient is status post right hip
arthroplasty. Mild sclerosis of left femoral head may reflect
changes of AVN.
IMPRESSION: 1. PEG tube tip within the lumen of the stomach as outlined by
enteric contrast. No extravasation of contrast noted.
2. Aortoiliac atherosclerosis.
3. Mild sclerosis of the left femoral head may reflect changes of
AVN. No flattening noted however.

## 2018-03-13 IMAGING — DX DG ABD PORTABLE 1V
2 series · 2 of 2 positions shown · non-contrast
Comparison: Abdominal radiograph performed earlier today at [DATE]
p.m.

CLINICAL DATA: G-tube replacement with temporary Foley catheter.
Evaluate position of catheter. Initial encounter.

EXAM:
PORTABLE ABDOMEN - 1 VIEW

[abdomen kub (1 of 2)]
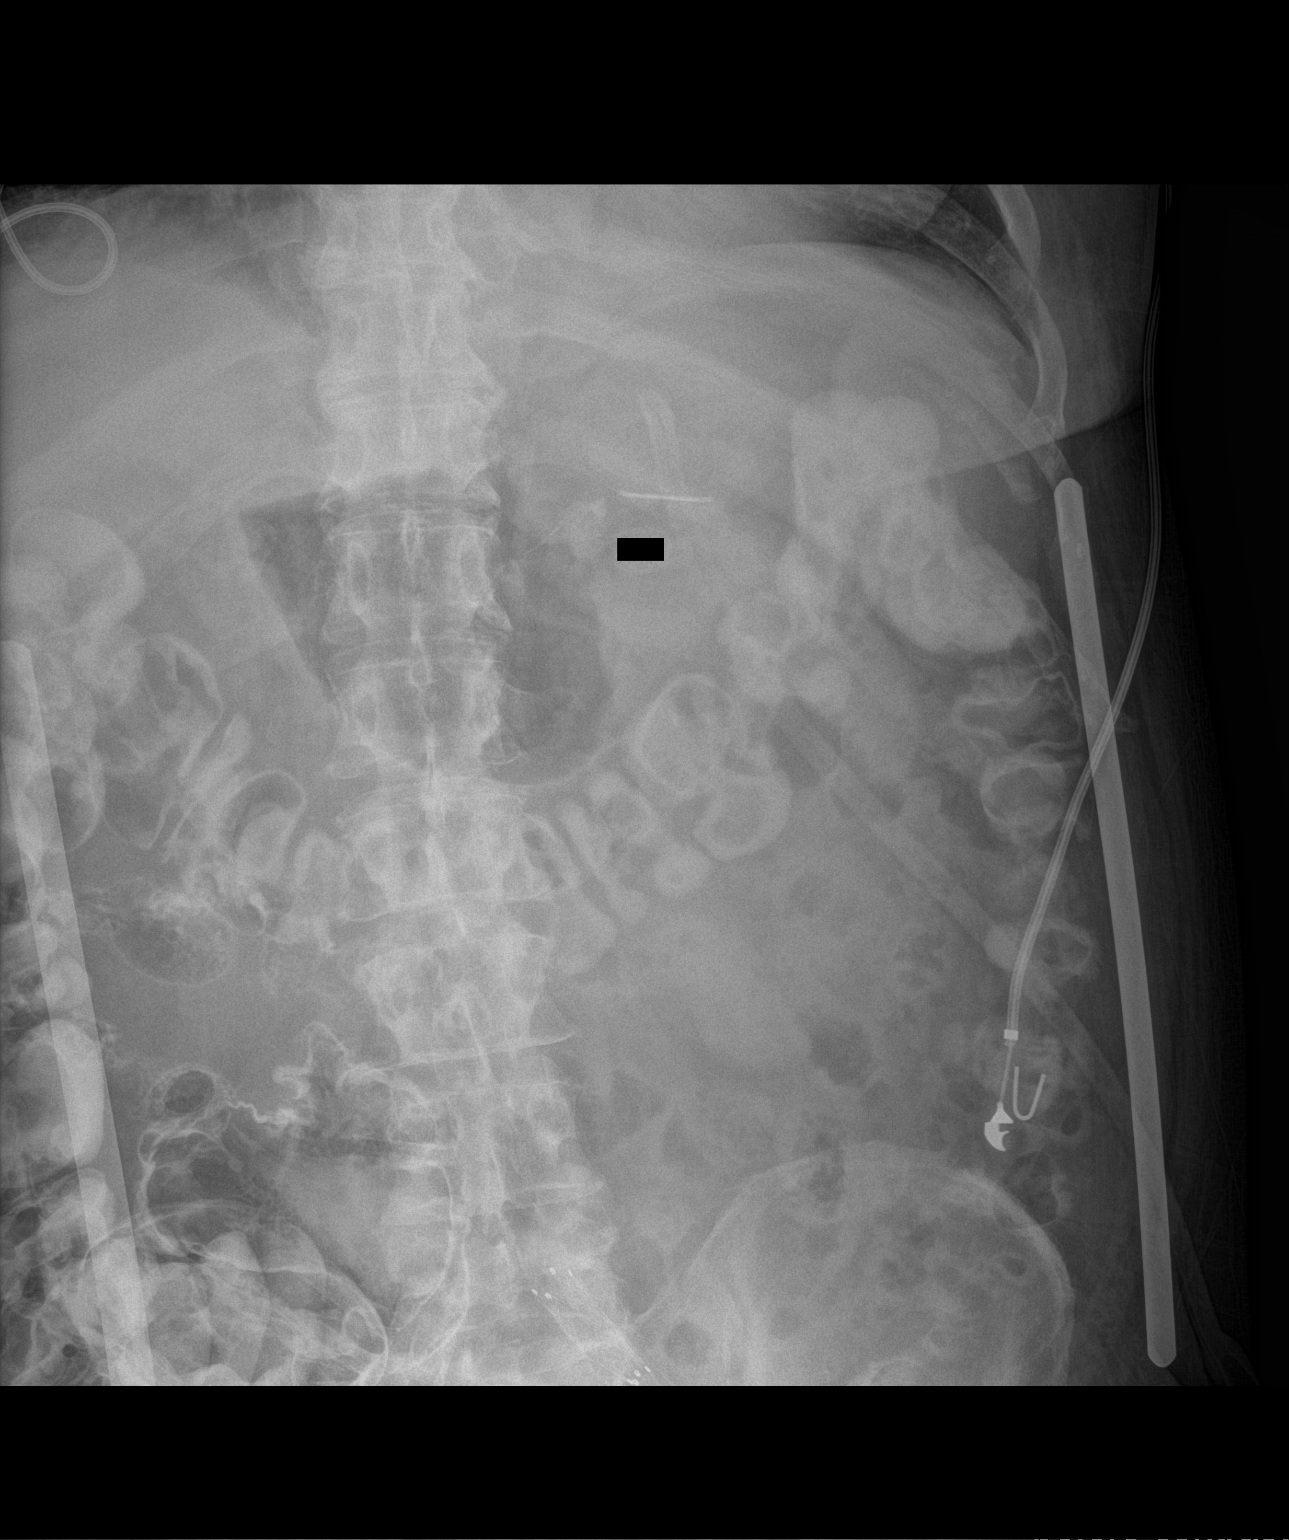

[abdomen kub (2 of 2)]
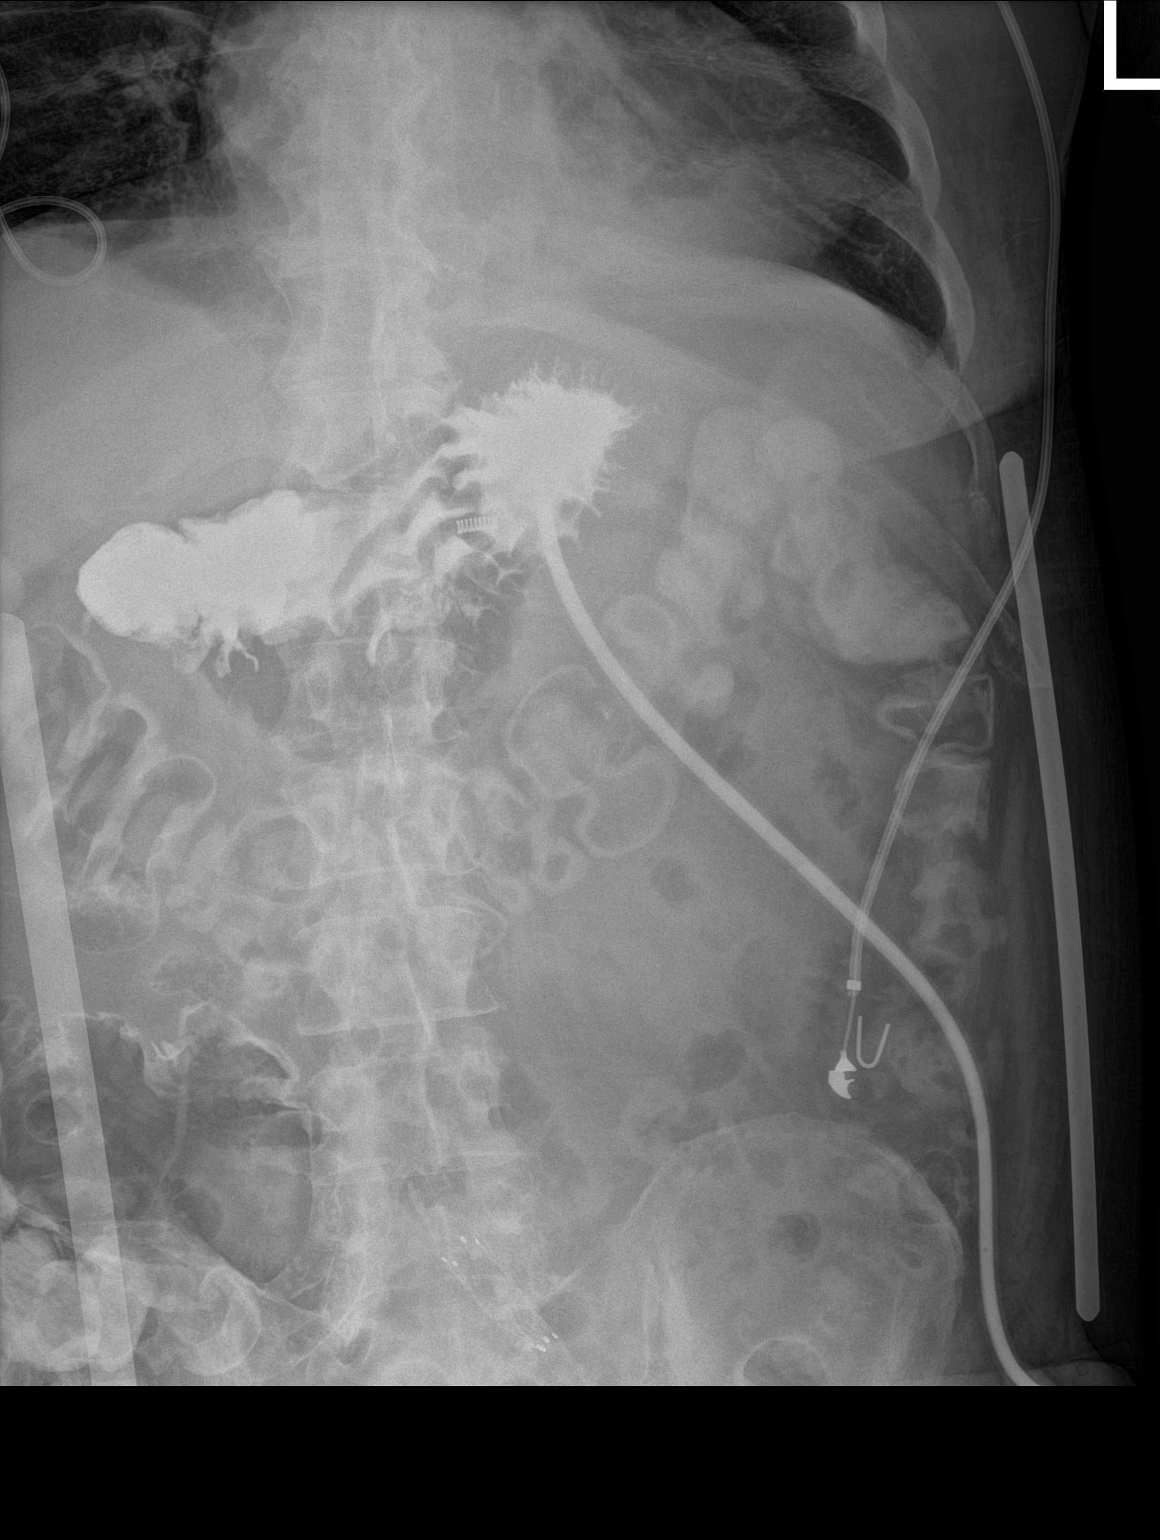

[2 of 2 positions shown; findings below may reference images not displayed]

FINDINGS: Contrast injected via the Foley catheter is seen filling the stomach
as expected.

Residual contrast is seen within the colon. Scattered vascular
calcifications are seen. A left common iliac artery stent is noted.
The visualized bowel gas pattern is grossly unremarkable.

Retrocardiac airspace opacity raises question for infection.
IMPRESSION: 1. Contrast injected via the Foley catheter is seen filling the
stomach as expected.
2. Scattered vascular calcifications seen.
3. Retrocardiac airspace opacification raises question for
pneumonia.

## 2018-03-15 IMAGING — XA IR REPLACE G-TUBE/COLONIC TUBE
1 series · 8 of 8 positions shown · non-contrast
Comparison: None.

INDICATION: Inadvertent removal of gastrostomy tube. Foley catheter placed in
gastrostomy tube track for the purposes of track preservation.

EXAM:
FLUOROSCOPIC GUIDED REPLACEMENT OF GASTROSTOMY TUBE

[Series 1: fl (-) angio · 8 of 8 slices shown]
[im 1/8]
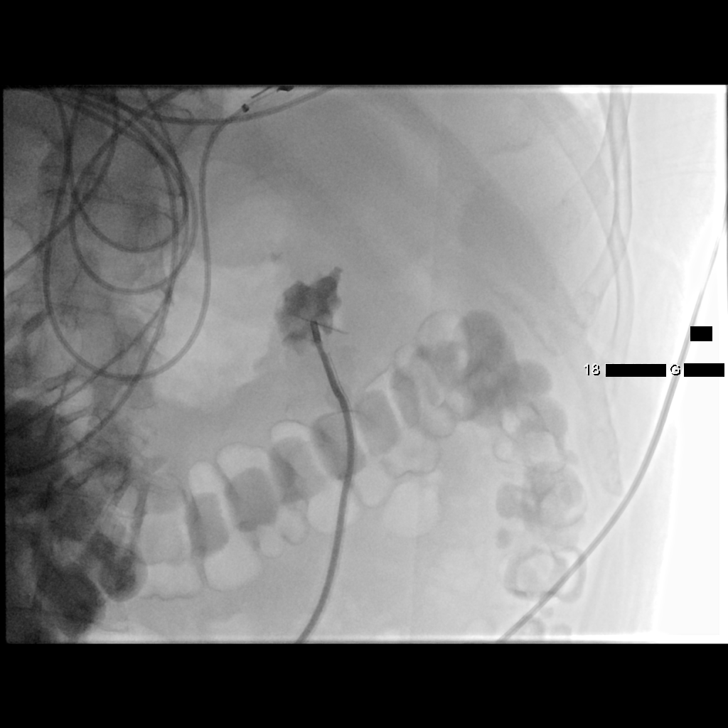
[im 2/8]
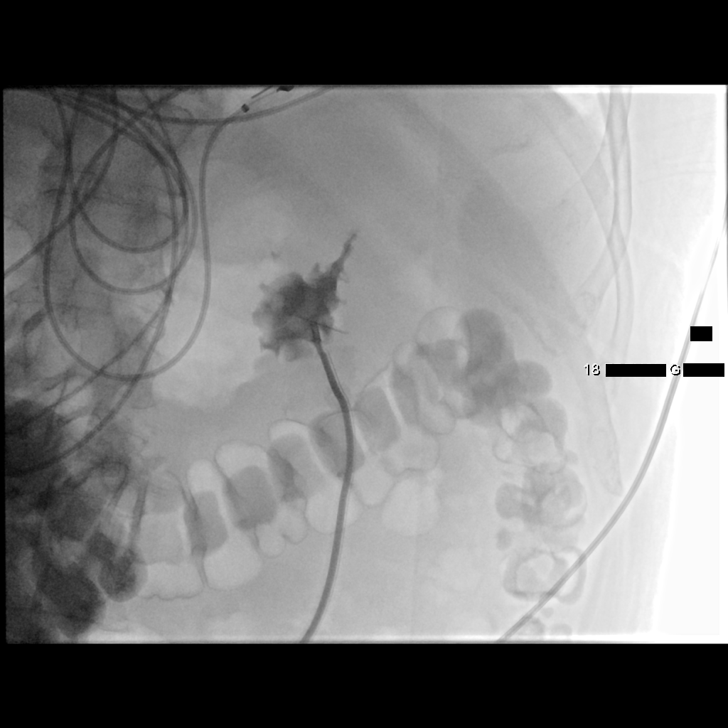
[im 3/8]
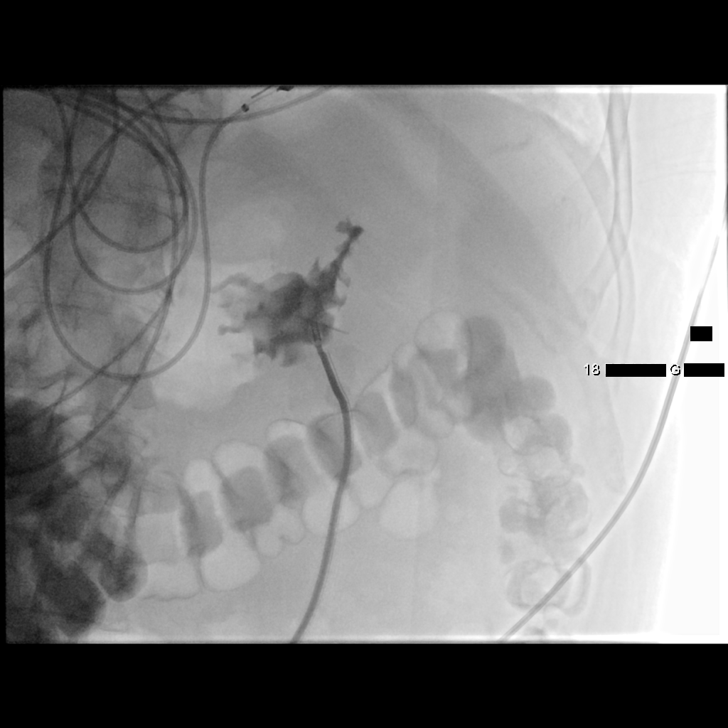
[im 4/8]
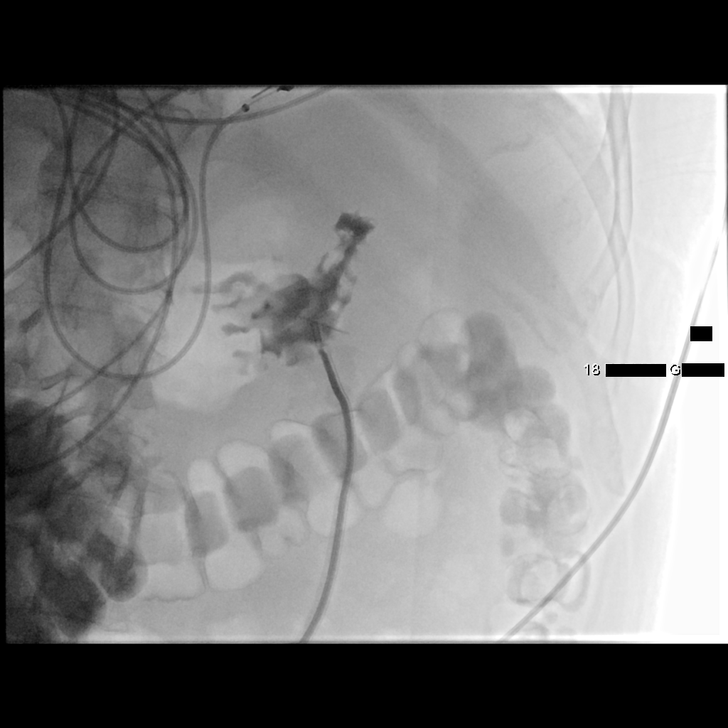
[im 5/8]
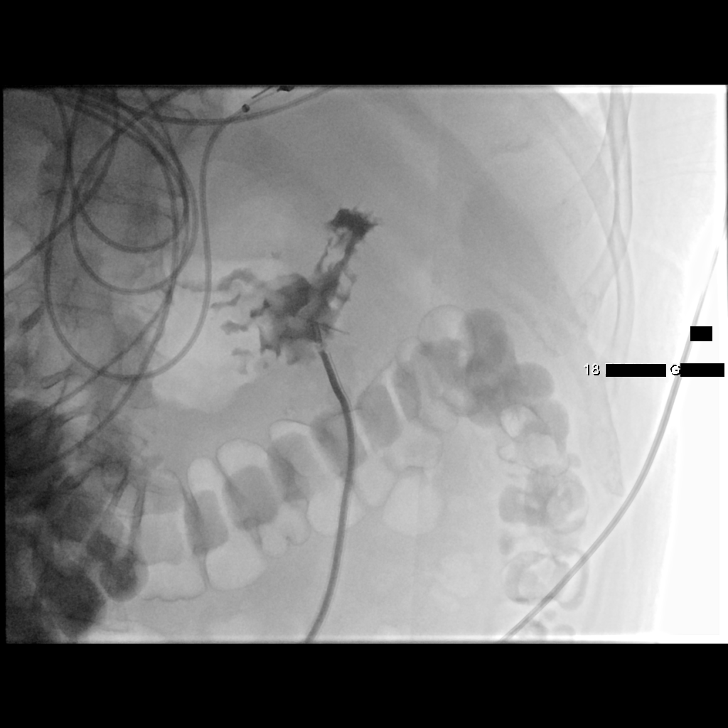
[im 6/8]
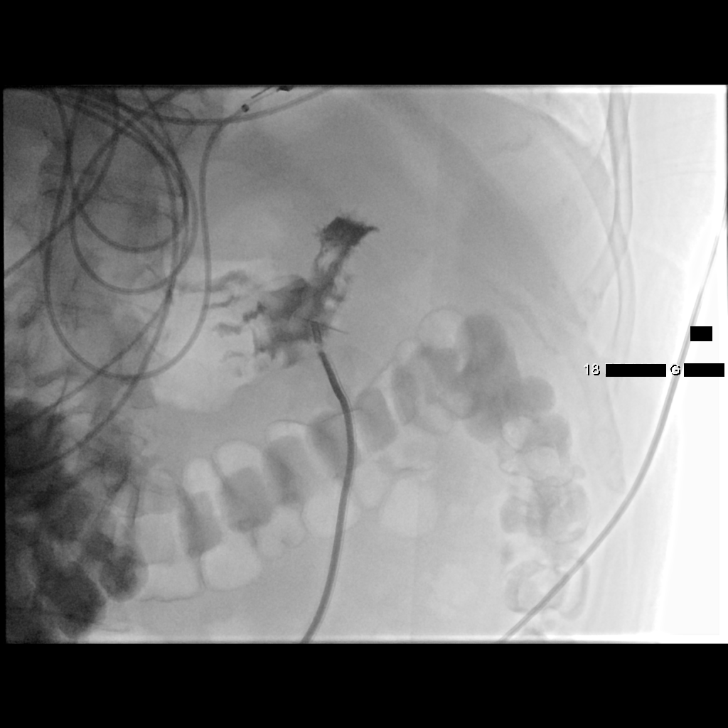
[im 7/8]
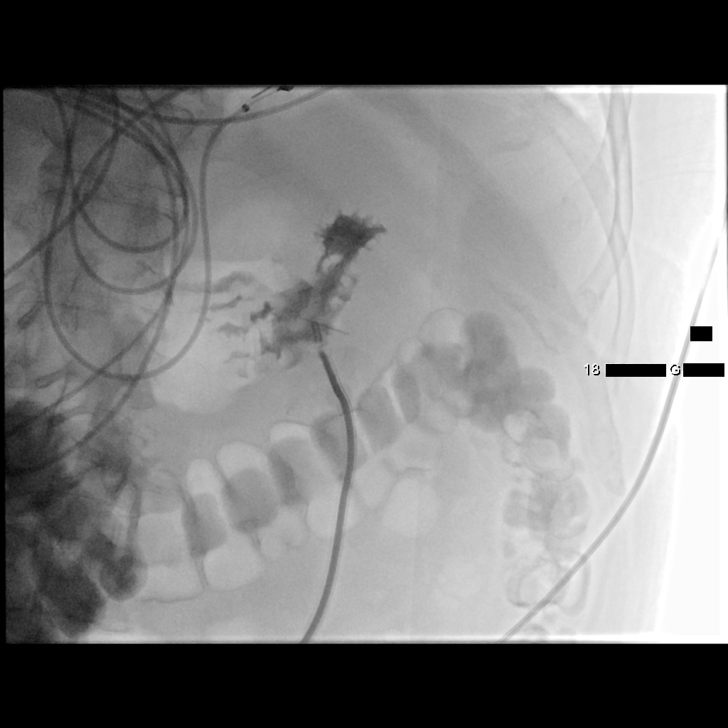
[im 8/8]
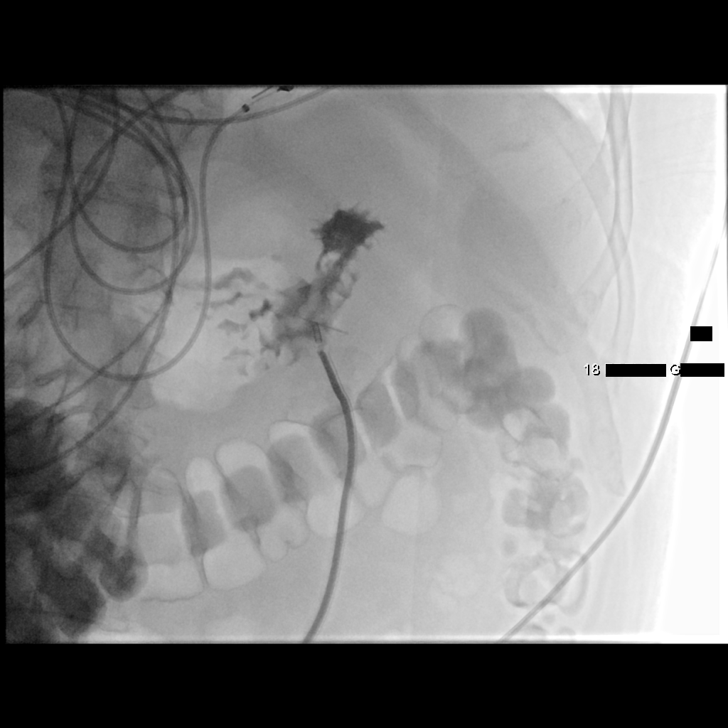

[8 of 8 positions shown; findings below may reference images not displayed]

MEDICATIONS:
None.

CONTRAST:  10 cc Isovue 300 administered into the gastric lumen

FLUOROSCOPY TIME:  6 seconds (2 mGy)

COMPLICATIONS:
None immediate.

PROCEDURE:
Existing Foley catheter utilized maintain patency of the gastrostomy
tract was removed intact.

A new 18 French balloon retention gastrostomy tube was inserted via
the gastrostomy tube track. The balloon was inflated and disc was
cinched. Contrast was injected and several spot fluoroscopic images
were obtained in various obliquities to confirm appropriate
intraluminal positioning. A dressing was placed. The patient
tolerated the procedure well without immediate postprocedural
complication.
IMPRESSION: Successful fluoroscopic guided replacement of a new 18-French
gastrostomy tube. The new gastrostomy tube is ready for immediate
use.
